# Patient Record
Sex: Female | Born: 1970 | Race: Black or African American | Hispanic: No | Marital: Single | Smoking: Never smoker
Health system: Southern US, Community
[De-identification: ages and names within clinical notes are randomized; demographics above are authoritative.]

## PROBLEM LIST (undated history)

## (undated) DIAGNOSIS — M1990 Unspecified osteoarthritis, unspecified site: Secondary | ICD-10-CM

## (undated) DIAGNOSIS — F410 Panic disorder [episodic paroxysmal anxiety] without agoraphobia: Secondary | ICD-10-CM

## (undated) DIAGNOSIS — M069 Rheumatoid arthritis, unspecified: Secondary | ICD-10-CM

## (undated) DIAGNOSIS — Z86718 Personal history of other venous thrombosis and embolism: Secondary | ICD-10-CM

## (undated) DIAGNOSIS — I8390 Asymptomatic varicose veins of unspecified lower extremity: Secondary | ICD-10-CM

## (undated) DIAGNOSIS — R011 Cardiac murmur, unspecified: Secondary | ICD-10-CM

## (undated) DIAGNOSIS — D649 Anemia, unspecified: Secondary | ICD-10-CM

## (undated) DIAGNOSIS — M62838 Other muscle spasm: Secondary | ICD-10-CM

## (undated) DIAGNOSIS — D573 Sickle-cell trait: Secondary | ICD-10-CM

## (undated) DIAGNOSIS — D509 Iron deficiency anemia, unspecified: Secondary | ICD-10-CM

## (undated) DIAGNOSIS — Z9289 Personal history of other medical treatment: Secondary | ICD-10-CM

## (undated) HISTORY — DX: Rheumatoid arthritis, unspecified: M06.9

## (undated) HISTORY — DX: Personal history of other venous thrombosis and embolism: Z86.718

## (undated) HISTORY — DX: Sickle-cell trait: D57.3

## (undated) HISTORY — DX: Anemia, unspecified: D64.9

## (undated) HISTORY — DX: Asymptomatic varicose veins of unspecified lower extremity: I83.90

---

## 2005-07-16 HISTORY — PX: TUBAL LIGATION: SHX77

## 2011-10-08 ENCOUNTER — Emergency Department (HOSPITAL_COMMUNITY)
Admission: EM | Admit: 2011-10-08 | Discharge: 2011-10-08 | Disposition: A | Discharge status: Home / Self Care | Payer: Medicaid Other | Attending: Emergency Medicine | Admitting: Emergency Medicine

## 2011-10-08 ENCOUNTER — Emergency Department (HOSPITAL_COMMUNITY)
Admission: EM | Admit: 2011-10-08 | Discharge: 2011-10-08 | Disposition: A | Discharge status: Home / Self Care | Payer: No Typology Code available for payment source

## 2011-10-08 ENCOUNTER — Encounter (HOSPITAL_COMMUNITY): Payer: Self-pay | Admitting: Emergency Medicine

## 2011-10-08 DIAGNOSIS — M545 Low back pain, unspecified: Secondary | ICD-10-CM

## 2011-10-08 MED ORDER — OXYCODONE HCL 5 MG PO TABA: 5.0000 mg | ORAL_TABLET | Freq: Four times a day (QID) | ORAL | Status: DC | PRN
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
## 2011-10-08 NOTE — Discharge Instructions (Signed)
Back Exercises Back exercises help treat and prevent back injuries. The goal of back exercises is to increase the strength of your abdominal and back muscles and the flexibility of your back. These exercises should be started when you no longer have back pain. Back exercises include:  Pelvic Tilt. Lie on your back with your knees bent. Tilt your pelvis until the lower part of your back is against the floor. Hold this position 5 to 10 sec and repeat 5 to 10 times.   Knee to Chest. Pull first 1 knee up against your chest and hold for 20 to 30 seconds, repeat this with the other knee, and then both knees. This may be done with the other leg straight or bent, whichever feels better.   Sit-Ups or Curl-Ups. Bend your knees 90 degrees. Start with tilting your pelvis, and do a partial, slow sit-up, lifting your trunk only 30 to 45 degrees off the floor. Take at least 2 to 3 seconds for each sit-up. Do not do sit-ups with your knees out straight. If partial sit-ups are difficult, simply do the above but with only tightening your abdominal muscles and holding it as directed.   Hip-Lift. Lie on your back with your knees flexed 90 degrees. Push down with your feet and shoulders as you raise your hips a couple inches off the floor; hold for 10 seconds, repeat 5 to 10 times.   Back arches. Lie on your stomach, propping yourself up on bent elbows. Slowly press on your hands, causing an arch in your low back. Repeat 3 to 5 times. Any initial stiffness and discomfort should lessen with repetition over time.   Shoulder-Lifts. Lie face down with arms beside your body. Keep hips and torso pressed to floor as you slowly lift your head and shoulders off the floor.  Do not overdo your exercises, especially in the beginning. Exercises may cause you some mild back discomfort which lasts for a few minutes; however, if the pain is more severe, or lasts for more than 15 minutes, do not continue exercises until you see your  caregiver. Improvement with exercise therapy for back problems is slow.  See your caregivers for assistance with developing a proper back exercise program. Document Released: 08/09/2004 Document Revised: 06/21/2011 Document Reviewed: 07/02/2005 ExitCare Patient Information 2012 [employer].Back Pain, Adult Low back pain is very common. About 1 in 5 people have back pain.The cause of low back pain is rarely dangerous. The pain often gets better over time.About half of people with a sudden onset of back pain feel better in just 2 weeks. About 8 in 10 people feel better by 6 weeks.  CAUSES Some common causes of back pain include:  Strain of the muscles or ligaments supporting the spine.   Wear and tear (degeneration) of the spinal discs.   Arthritis.   Direct injury to the back.  DIAGNOSIS Most of the time, the direct cause of low back pain is not known.However, back pain can be treated effectively even when the exact cause of the pain is unknown.Answering your caregiver's questions about your overall health and symptoms is one of the most accurate ways to make sure the cause of your pain is not dangerous. If your caregiver needs more information, he or she may order lab work or imaging tests (X-rays or MRIs).However, even if imaging tests show changes in your back, this usually does not require surgery. HOME CARE INSTRUCTIONS For many people, back pain returns.Since low back pain is rarely   dangerous, it is often a condition that people can learn to manageon their own.   Remain active. It is stressful on the back to sit or stand in one place. Do not sit, drive, or stand in one place for more than 30 minutes at a time. Take short walks on level surfaces as soon as pain allows.Try to increase the length of time you walk each day.   Do not stay in bed.Resting more than 1 or 2 days can delay your recovery.   Do not avoid exercise or work.Your body is made to move.It is not dangerous  to be active, even though your back may hurt.Your back will likely heal faster if you return to being active before your pain is gone.   Pay attention to your body when you bend and lift. Many people have less discomfortwhen lifting if they bend their knees, keep the load close to their bodies,and avoid twisting. Often, the most comfortable positions are those that put less stress on your recovering back.   Find a comfortable position to sleep. Use a firm mattress and lie on your side with your knees slightly bent. If you lie on your back, put a pillow under your knees.   Only take over-the-counter or prescription medicines as directed by your caregiver. Over-the-counter medicines to reduce pain and inflammation are often the most helpful.Your caregiver may prescribe muscle relaxant drugs.These medicines help dull your pain so you can more quickly return to your normal activities and healthy exercise.   Put ice on the injured area.   Put ice in a plastic bag.   Place a towel between your skin and the bag.   Leave the ice on for 15 to 20 minutes, 3 to 4 times a day for the first 2 to 3 days. After that, ice and heat may be alternated to reduce pain and spasms.   Ask your caregiver about trying back exercises and gentle massage. This may be of some benefit.   Avoid feeling anxious or stressed.Stress increases muscle tension and can worsen back pain.It is important to recognize when you are anxious or stressed and learn ways to manage it.Exercise is a great option.  SEEK MEDICAL CARE IF:  You have pain that is not relieved with rest or medicine.   You have pain that does not improve in 1 week.   You have new symptoms.   You are generally not feeling well.  SEEK IMMEDIATE MEDICAL CARE IF:   You have pain that radiates from your back into your legs.   You develop new bowel or bladder control problems.   You have unusual weakness or numbness in your arms or legs.   You develop  nausea or vomiting.   You develop abdominal pain.   You feel faint.  Document Released: 07/02/2005 Document Revised: 06/21/2011 Document Reviewed: 11/20/2010 ExitCare Patient Information 2012 [employer].  RESOURCE GUIDE  Dental Problems  Patients with Medicaid: [facility]                     [facility] [address]                                           [address] Phone:  [phone]                                                    Phone:  [phone]  If unable to pay or uninsured, contact:  [facility] or [facility] to become qualified for the adult dental clinic.  Chronic Pain Problems Contact [facility]  [phone] Patients need to be referred by their primary care doctor.  Insufficient Money for Medicine Contact [employer]:  call "211" or [facility] 271-5999.  No Primary Care Doctor Call [facility]  832-8000 Other agencies that provide inexpensive medical care    [facility]  832-8035    [facility]  832-7272    [facility]  [phone]    [facility] Clinic  832-4777    [facility]  [phone]    [facility]  [phone]  Psychological Services [facility]  [phone] [facility]  [phone] [facility]   800 853-5163 (emergency services 641-4993)  Substance Abuse Resources [facility]  336-882-2125 [facility] 336-784-9470 [facility] 336-285-9073 [facility] 336-845-3988 Residential & Outpatient Substance Abuse Program  800-659-3381  Abuse/Neglect [place] Child Abuse Hotline (336) 641-3795 [place] Child Abuse Hotline 800-378-5315 (After Hours)  Emergency Shelter [facility] (336) 271-5985  [facility] (336) 275-9566 [facility] (704) 372-4663  MRSA Hotline #:    832-7006    [place] [facility]  [facility]     [employer]                          [facility] [address] [place]                       [address]      [address]  [place]                                                [place]                            [place] Phone:  [phone]                                   Phone:  [phone]                 Phone:  [phone]  [facility] Phone:  [phone]  [place] Child Abuse Hotline (336) 342-1394 (336) 342-3537 (After Hours)   

## 2011-10-09 ENCOUNTER — Encounter (HOSPITAL_COMMUNITY): Payer: Self-pay | Admitting: Emergency Medicine

## 2011-10-09 ENCOUNTER — Emergency Department (HOSPITAL_COMMUNITY)
Admission: EM | Admit: 2011-10-09 | Discharge: 2011-10-10 | Disposition: A | Discharge status: Home / Self Care | Payer: Medicaid Other | Attending: Emergency Medicine | Admitting: Emergency Medicine

## 2011-10-09 DIAGNOSIS — M545 Low back pain, unspecified: Secondary | ICD-10-CM

## 2011-10-09 DIAGNOSIS — IMO0002 Reserved for concepts with insufficient information to code with codable children: Secondary | ICD-10-CM | POA: Insufficient documentation

## 2011-10-09 MED ORDER — DIAZEPAM 5 MG PO TABS
5.0000 mg | ORAL_TABLET | Freq: Once | ORAL | Status: AC
  Administered 2011-10-09: 5 mg via ORAL
  Filled 2011-10-09: qty 1

## 2011-10-09 MED ORDER — HYDROMORPHONE HCL PF 1 MG/ML IJ SOLN
1.0000 mg | Freq: Once | INTRAMUSCULAR | Status: AC
  Administered 2011-10-09: 1 mg via INTRAMUSCULAR
  Filled 2011-10-09: qty 1

## 2011-10-09 MED ORDER — KETOROLAC TROMETHAMINE 30 MG/ML IJ SOLN
30.0000 mg | Freq: Once | INTRAMUSCULAR | Status: AC
  Administered 2011-10-09: 30 mg via INTRAMUSCULAR
  Filled 2011-10-09: qty 1
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
## 2011-10-09 NOTE — ED Provider Notes (Signed)
Medical screening examination/treatment/procedure(s) were performed by non-physician practitioner and as supervising physician I was immediately available for consultation/collaboration.    Tylik Treese R Leitha Hyppolite, MD 10/09/11 0044 

## 2011-10-10 ENCOUNTER — Encounter (HOSPITAL_COMMUNITY): Payer: Self-pay | Admitting: Emergency Medicine

## 2011-10-10 MED ORDER — CYCLOBENZAPRINE HCL 10 MG PO TABS: 10.0000 mg | ORAL_TABLET | Freq: Three times a day (TID) | ORAL | Status: AC | PRN
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
## 2011-10-10 NOTE — Discharge Instructions (Signed)
Narcotic and benzodiazepine use may cause drowsiness, slowed breathing or dependence.  Please use with caution and do not drive, operate machinery or watch young children alone while taking them.  Taking combinations of these medications or drinking alcohol will potentiate these effects.    

## 2011-11-23 ENCOUNTER — Ambulatory Visit
Admission: RE | Admit: 2011-11-23 | Discharge: 2011-11-23 | Disposition: A | Discharge status: Home / Self Care | Payer: Medicaid Other | Source: Ambulatory Visit | Attending: Family Medicine | Admitting: Family Medicine

## 2011-11-23 ENCOUNTER — Other Ambulatory Visit: Payer: Self-pay | Admitting: Family Medicine

## 2011-11-30 ENCOUNTER — Telehealth: Payer: Self-pay | Admitting: Oncology

## 2011-11-30 NOTE — Telephone Encounter (Signed)
lmonvm adviisng the pt of her new pt appt with dr ha °

## 2011-12-03 ENCOUNTER — Telehealth: Payer: Self-pay | Admitting: Oncology

## 2011-12-03 NOTE — Telephone Encounter (Signed)
S/w the pt and she is aware of her new pt appt on 12/11/2011 with dr Clelia Croft.

## 2011-12-03 NOTE — Telephone Encounter (Signed)
Referred by Dr. Alvin Blount Dx- Anemia  

## 2011-12-07 ENCOUNTER — Other Ambulatory Visit: Payer: Self-pay | Admitting: Oncology

## 2011-12-07 DIAGNOSIS — D649 Anemia, unspecified: Secondary | ICD-10-CM

## 2011-12-11 ENCOUNTER — Other Ambulatory Visit (HOSPITAL_BASED_OUTPATIENT_CLINIC_OR_DEPARTMENT_OTHER): Payer: Medicaid Other | Admitting: Lab

## 2011-12-11 ENCOUNTER — Ambulatory Visit: Payer: Medicaid Other

## 2011-12-11 ENCOUNTER — Telehealth: Payer: Self-pay | Admitting: Oncology

## 2011-12-11 ENCOUNTER — Ambulatory Visit (HOSPITAL_BASED_OUTPATIENT_CLINIC_OR_DEPARTMENT_OTHER): Payer: Medicaid Other | Admitting: Oncology

## 2011-12-11 ENCOUNTER — Encounter: Payer: Self-pay | Admitting: Oncology

## 2011-12-11 VITALS — BP 132/74 | HR 72 | Temp 98.6°F | Ht 64.0 in | Wt 179.2 lb

## 2011-12-11 DIAGNOSIS — D649 Anemia, unspecified: Secondary | ICD-10-CM

## 2011-12-11 HISTORY — DX: Anemia, unspecified: D64.9

## 2011-12-11 LAB — CBC WITH DIFFERENTIAL/PLATELET
BASO%: 0.5 % (ref 0.0–2.0)
Basophils Absolute: 0 10*3/uL (ref 0.0–0.1)
EOS%: 4.5 % (ref 0.0–7.0)
HGB: 7.2 g/dL — ABNORMAL LOW (ref 11.6–15.9)
MCH: 16.9 pg — ABNORMAL LOW (ref 25.1–34.0)
MCHC: 30.7 g/dL — ABNORMAL LOW (ref 31.5–36.0)
MCV: 55.1 fL — ABNORMAL LOW (ref 79.5–101.0)
MONO%: 4.8 % (ref 0.0–14.0)
NEUT%: 70.8 % (ref 38.4–76.8)
RDW: 22.1 % — ABNORMAL HIGH (ref 11.2–14.5)

## 2011-12-11 NOTE — Progress Notes (Signed)
Note dictated

## 2011-12-11 NOTE — Telephone Encounter (Signed)
Gv pt appt for [id number] 

## 2011-12-12 ENCOUNTER — Ambulatory Visit (HOSPITAL_BASED_OUTPATIENT_CLINIC_OR_DEPARTMENT_OTHER): Payer: Medicaid Other

## 2011-12-12 ENCOUNTER — Encounter: Payer: Self-pay | Admitting: Oncology

## 2011-12-12 VITALS — BP 113/66 | HR 79 | Temp 98.1°F

## 2011-12-12 DIAGNOSIS — D509 Iron deficiency anemia, unspecified: Secondary | ICD-10-CM

## 2011-12-12 DIAGNOSIS — D649 Anemia, unspecified: Secondary | ICD-10-CM

## 2011-12-12 MED ORDER — SODIUM CHLORIDE 0.9 % IV SOLN
Freq: Once | INTRAVENOUS | Status: AC
  Administered 2011-12-12: 16:00:00 via INTRAVENOUS

## 2011-12-12 MED ORDER — SODIUM CHLORIDE 0.9 % IV SOLN
1020.0000 mg | Freq: Once | INTRAVENOUS | Status: AC
  Administered 2011-12-12: 1020 mg via INTRAVENOUS
  Filled 2011-12-12: qty 34

## 2011-12-12 NOTE — Progress Notes (Signed)
New patient, patient has medicaid, patient was not in need of financial assistance patient was very emotional, she thought she was here for Cancer,I gave her my contact information.
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
## 2011-12-12 NOTE — Patient Instructions (Signed)
[facility] Discharge Instructions  Today you received the following chemotherapy agents Fereheme (Iron Infusion)  This will help in increasing your iron levels related to iron deficiency anemia.    If you develop nausea and vomiting that is not controlled by your nausea medication, call the clinic. If it is after clinic hours your family physician or the after hours number for the clinic or go to the Emergency Department.   BELOW ARE SYMPTOMS THAT SHOULD BE REPORTED IMMEDIATELY:  *FEVER GREATER THAN 100.5 F  *CHILLS WITH OR WITHOUT FEVER  NAUSEA AND VOMITING THAT IS NOT CONTROLLED WITH YOUR NAUSEA MEDICATION  *UNUSUAL SHORTNESS OF BREATH  *UNUSUAL BRUISING OR BLEEDING  TENDERNESS IN MOUTH AND THROAT WITH OR WITHOUT PRESENCE OF ULCERS  *URINARY PROBLEMS  *BOWEL PROBLEMS  UNUSUAL RASH Items with * indicate a potential emergency and should be followed up as soon as possible.  One of the nurses will contact you 24 hours after your treatment. Please let the nurse know about any problems that you may have experienced. Feel free to call the clinic you have any questions or concerns. The clinic phone number is [phone].   I have been informed and understand all the instructions given to me. I know to contact the clinic, my physician, or go to the Emergency Department if any problems should occur. I do not have any questions at this time, but understand that I may call the clinic during office hours   should I have any questions or need assistance in obtaining follow up care.    __________________________________________  _____________  __________ Signature of Patient or Authorized Representative            Date                   Time    __________________________________________ Nurse's Signature

## 2011-12-13 LAB — COMPREHENSIVE METABOLIC PANEL
AST: 19 U/L (ref 0–37)
Alkaline Phosphatase: 67 U/L (ref 39–117)
BUN: 12 mg/dL (ref 6–23)
Glucose, Bld: 103 mg/dL — ABNORMAL HIGH (ref 70–99)
Total Bilirubin: 0.2 mg/dL — ABNORMAL LOW (ref 0.3–1.2)

## 2011-12-13 LAB — IRON AND TIBC
Iron: <10 ug/dL — ABNORMAL LOW (ref 42–145)
UIBC: 423 ug/dL — ABNORMAL HIGH (ref 125–400)

## 2011-12-13 LAB — HEMOGLOBINOPATHY EVALUATION: Hgb A: 63.9 % — ABNORMAL LOW (ref 96.8–97.8)

## 2011-12-18 ENCOUNTER — Encounter: Payer: Self-pay | Admitting: Obstetrics and Gynecology

## 2011-12-18 ENCOUNTER — Ambulatory Visit (INDEPENDENT_AMBULATORY_CARE_PROVIDER_SITE_OTHER): Payer: Medicaid Other | Admitting: Obstetrics and Gynecology

## 2011-12-18 VITALS — BP 130/62 | Ht 64.0 in | Wt 182.0 lb

## 2011-12-18 DIAGNOSIS — D649 Anemia, unspecified: Secondary | ICD-10-CM

## 2011-12-18 DIAGNOSIS — N920 Excessive and frequent menstruation with regular cycle: Secondary | ICD-10-CM

## 2011-12-18 DIAGNOSIS — Z0000 Encounter for general adult medical examination without abnormal findings: Secondary | ICD-10-CM

## 2011-12-18 DIAGNOSIS — Z124 Encounter for screening for malignant neoplasm of cervix: Secondary | ICD-10-CM

## 2011-12-18 NOTE — Progress Notes (Signed)
Pt was referred from Dr. Bruna Potter. Pt c/o irregular periods with heavy bleeding. Pt states that her iron is low and also has low blood count. Pt reports she has abnormal bleeding. heavier than period and passing medium clots  It has been going on for over ten years years   Pt has dysmenorrhayes  Her pain is a 6/10.   She uses 1 of tampons or pads every 1 hours.  She has tried nothing.  It has not worked.  Pt denies chest pain or SOB Pt does get iron infusions for anemia Physical Examination: General appearance - alert, well appearing, and in no distress Mental status - normal mood, behavior, speech, dress, motor activity, and thought processes Neck - supple, no significant adenopathy, thyroid exam: thyroid is normal in size without nodules or tenderness Chest - clear to auscultation, no wheezes, rales or rhonchi, symmetric air entry Heart - normal rate and regular rhythm Abdomen - soft, nontender, nondistended, no masses or organomegaly Breasts - breasts appear normal, no suspicious masses, no skin or nipple changes or axillary nodes Pelvic - normal external genitalia, vulva, vagina, cervix, uterus and adnexa Rectal - normal rectal, no masses, rectal exam not indicated Back exam - full range of motion, no tenderness, palpable spasm or pain on motion Neurological - alert, oriented, normal speech, no focal findings or movement disorder noted Musculoskeletal - no joint tenderness, deformity or swelling Extremities - no edema, redness or tenderness in the calves or thighs Skin - normal coloration and turgor, no rashes, no suspicious skin lesions noted Menorrhagia, anemia Routine exam Pap sent yes Mammogram due yes and scheduled pt has BTL Need op notes from cesarean and consult from hematology.  Pt with h/o of c/s times 4 RT for shg/us / and embx

## 2011-12-21 ENCOUNTER — Telehealth: Payer: Self-pay

## 2011-12-21 LAB — PAP IG, CT-NG, RFX HPV ASCU: Chlamydia Probe Amp: NEGATIVE

## 2011-12-21 NOTE — Telephone Encounter (Signed)
Lm on v tc rgd roi

## 2011-12-21 NOTE — Telephone Encounter (Signed)
Lm on v tcb rgd roi

## 2011-12-21 NOTE — Telephone Encounter (Signed)
Spoke with pt rgd roi pt will have drs office fax records on monday

## 2011-12-24 ENCOUNTER — Telehealth: Payer: Self-pay

## 2011-12-24 NOTE — Telephone Encounter (Signed)
Message copied by Rolla Plate on Mon Dec 24, 2011 10:32 AM ------      Message from: Jaymes Graff      Created: Sat Dec 22, 2011 11:13 PM       Please tell patient her pap results and that she can repeat her pap in one year.  Thank you

## 2011-12-24 NOTE — Telephone Encounter (Signed)
Spoke with pt rgd labs informed pap ascus will repeat in 1 yr pt voice understanding

## 2011-12-27 ENCOUNTER — Telehealth: Payer: Self-pay | Admitting: Oncology

## 2011-12-27 NOTE — Telephone Encounter (Signed)
R/s 7/30 appt to 8/6 due to [facility] out. S/w pt re new appt for 8/6 today.

## 2012-01-10 ENCOUNTER — Ambulatory Visit
Admission: RE | Admit: 2012-01-10 | Discharge: 2012-01-10 | Disposition: A | Discharge status: Home / Self Care | Payer: Medicaid Other | Source: Ambulatory Visit | Attending: Obstetrics and Gynecology | Admitting: Obstetrics and Gynecology

## 2012-01-10 DIAGNOSIS — Z0000 Encounter for general adult medical examination without abnormal findings: Secondary | ICD-10-CM

## 2012-02-12 ENCOUNTER — Other Ambulatory Visit: Payer: Medicaid Other | Admitting: Lab

## 2012-02-12 ENCOUNTER — Ambulatory Visit: Payer: Medicaid Other | Admitting: Oncology

## 2012-02-19 ENCOUNTER — Other Ambulatory Visit (HOSPITAL_BASED_OUTPATIENT_CLINIC_OR_DEPARTMENT_OTHER): Payer: Medicaid Other | Admitting: Lab

## 2012-02-19 ENCOUNTER — Ambulatory Visit (HOSPITAL_BASED_OUTPATIENT_CLINIC_OR_DEPARTMENT_OTHER): Payer: Medicaid Other | Admitting: Oncology

## 2012-02-19 ENCOUNTER — Encounter: Payer: Self-pay | Admitting: Oncology

## 2012-02-19 ENCOUNTER — Telehealth: Payer: Self-pay | Admitting: *Deleted

## 2012-02-19 VITALS — BP 140/86 | HR 74 | Temp 97.1°F | Resp 18 | Ht 64.0 in | Wt 192.6 lb

## 2012-02-19 DIAGNOSIS — D649 Anemia, unspecified: Secondary | ICD-10-CM

## 2012-02-19 DIAGNOSIS — D509 Iron deficiency anemia, unspecified: Secondary | ICD-10-CM

## 2012-02-19 LAB — CBC WITH DIFFERENTIAL/PLATELET
EOS%: 5.2 % (ref 0.0–7.0)
Eosinophils Absolute: 0.2 10*3/uL (ref 0.0–0.5)
LYMPH%: 29.9 % (ref 14.0–49.7)
MCH: 24.8 pg — ABNORMAL LOW (ref 25.1–34.0)
MCHC: 34.5 g/dL (ref 31.5–36.0)
MCV: 71.9 fL — ABNORMAL LOW (ref 79.5–101.0)
MONO%: 6.5 % (ref 0.0–14.0)
NEUT#: 2.7 10*3/uL (ref 1.5–6.5)
Platelets: 193 10*3/uL (ref 145–400)
RBC: 4.56 10*6/uL (ref 3.70–5.45)
nRBC: 0 % (ref 0–0)

## 2012-02-19 LAB — COMPREHENSIVE METABOLIC PANEL
ALT: 11 U/L (ref 0–35)
CO2: 24 mEq/L (ref 19–32)
Creatinine, Ser: 0.8 mg/dL (ref 0.50–1.10)
Total Bilirubin: 0.2 mg/dL — ABNORMAL LOW (ref 0.3–1.2)

## 2012-02-19 LAB — IRON AND TIBC
%SAT: 13 % — ABNORMAL LOW (ref 20–55)
Iron: 46 ug/dL (ref 42–145)
UIBC: 299 ug/dL (ref 125–400)

## 2012-02-19 LAB — FERRITIN: Ferritin: 5 ng/mL — ABNORMAL LOW (ref 10–291)

## 2012-02-19 NOTE — Patient Instructions (Addendum)
Iron-Rich Diet An iron-rich diet contains foods that are good sources of iron. Iron is an important mineral that helps your body produce hemoglobin. Hemoglobin is a protein in red blood cells that carries oxygen to the body's tissues. Sometimes, the iron level in your blood can be low. This may be caused by:  A lack of iron in your diet.   Blood loss.   Times of growth, such as during pregnancy or during a child's growth and development.  Low levels of iron can cause a decrease in the number of red blood cells. This can result in iron deficiency anemia. Iron deficiency anemia symptoms include:  Tiredness.   Weakness.   Irritability.   Increased chance of infection.  Here are some recommendations for daily iron intake:  Males older than 42 years of age need 8 mg of iron per day.   Women ages 36 to 61 need 18 mg of iron per day.   Pregnant women need 27 mg of iron per day, and women who are over 36 years of age and breastfeeding need 9 mg of iron per day.   Women over the age of 82 need 8 mg of iron per day.  SOURCES OF IRON There are 2 types of iron that are found in food: heme iron and nonheme iron. Heme iron is absorbed by the body better than nonheme iron. Heme iron is found in meat, poultry, and fish. Nonheme iron is found in grains, beans, and vegetables. Heme Iron Sources Food / Iron (mg)  Chicken liver, 3 oz (85 g)/ 10 mg   Beef liver, 3 oz (85 g)/ 5.5 mg   Oysters, 3 oz (85 g)/ 8 mg   Beef, 3 oz (85 g)/ 2 to 3 mg   Shrimp, 3 oz (85 g)/ 2.8 mg   [place], 3 oz (85 g)/ 2 mg   Chicken, 3 oz (85 g) / 1 mg   Fish (tuna, halibut), 3 oz (85 g)/ 1 mg   Pork, 3 oz (85 g)/ 0.9 mg  Nonheme Iron Sources Food / Iron (mg)  Ready-to-eat breakfast cereal, iron-fortified / 3.9 to 7 mg   Tofu,  cup / 3.4 mg   Kidney beans,  cup / 2.6 mg   Baked potato with skin / 2.7 mg   Asparagus,  cup / 2.2 mg   Avocado / 2 mg   Dried peaches,  cup / 1.6 mg   Raisins,  cup  / 1.5 mg   Soy milk, 1 cup / 1.5 mg   Whole-wheat bread, 1 slice / 1.2 mg   Spinach, 1 cup / 0.8 mg   Broccoli,  cup / 0.6 mg  IRON ABSORPTION Certain foods can decrease the body's absorption of iron. Try to avoid these foods and beverages while eating meals with iron-containing foods:  Coffee.   Tea.   Fiber.   Soy.  Foods containing vitamin C can help increase the amount of iron your body absorbs from iron sources, especially from nonheme sources. Eat foods with vitamin C along with iron-containing foods to increase your iron absorption. Foods that are high in vitamin C include many fruits and vegetables. Some good sources are:  Fresh orange juice.   Oranges.   Strawberries.   Mangoes.   Grapefruit.   Red bell peppers.   Green bell peppers.   Broccoli.   Potatoes with skin.   Tomato juice.  Document Released: 02/13/2005 Document Revised: 06/21/2011 Document Reviewed: 12/21/2010 [facility] Patient Information 2012 [place]. 

## 2012-02-19 NOTE — Telephone Encounter (Signed)
Gave patient appointment for 05-21-2012 starting at 9:30am

## 2012-02-19 NOTE — Progress Notes (Signed)
Hematology and Oncology Follow Up Visit  Kimberly Grimes [id number] October 19, 1970 41 y.o. 02/19/2012 9:45 AM Kimberly Grimes, [name], Kimberly Grimes, *   Principle Diagnosis: Iron deficiency anemia  Prior Therapy: Feraheme 1,020 mg IV on 12/12/11  Current therapy: Watchful observation  Interim History: Kimberly Grimes is a 41 year old female seen for routine follow-up today. She received a Feraheme infusion in May. No infusion-related reaction. She states she has been taking an over-the-counter iron supplement once a day. She remains fatigued. Appetite has been increased since the iron infusion. Continues to have heavy periods that last about 1 week. Denies chest pain, shortness of breath, and dyspnea. No abdominal pain, nausea, or vomiting. Denies hematuria and melena.   Medications: I have reviewed the patient's current medications. Current outpatient prescriptions:ALBUTEROL IN, Inhale 14.7 g into the lungs as needed., Disp: , Rfl: ;  Cyclobenzaprine HCl (FLEXERIL PO), Take by mouth as needed., Disp: , Rfl: ;  oxyCODONE (OXY IR/ROXICODONE) 5 MG immediate release tablet, Take 5 mg by mouth every 6 (six) hours as needed. For pain, Disp: , Rfl: ;  vitamin B-12 (CYANOCOBALAMIN) 100 MCG tablet, Take 50 mcg by mouth every other day., Disp: , Rfl:   Allergies:  Allergies  Allergen Reactions  . Tylenol (Acetaminophen) Other (See Comments)    Unknown reaction    Past Medical History, Surgical history, Social history, and Family History were reviewed and updated.  Review of Systems: Constitutional:  Negative for fever, chills, night sweats, anorexia, weight loss, pain. Cardiovascular: no chest pain or dyspnea on exertion Respiratory: no cough, shortness of breath, or wheezing Neurological: no TIA or stroke symptoms Dermatological: negative ENT: negative Skin: Negative. Gastrointestinal: no abdominal pain, change in bowel habits, or black or bloody stools Genito-Urinary: no dysuria, trouble  voiding, or hematuria Hematological and Lymphatic: negative Breast: negative for breast lumps Musculoskeletal: negative Remaining ROS negative.  Physical Exam: Blood pressure 140/86, pulse 74, temperature 97.1 F (36.2 C), temperature source Oral, resp. rate 18, height 5\' 4"  (1.626 m), weight 192 lb 9.6 oz (87.363 kg). ECOG:  General appearance: alert, cooperative and no distress Head: Normocephalic, without obvious abnormality, atraumatic Neck: no adenopathy, no carotid bruit, no JVD, supple, symmetrical, trachea midline and thyroid not enlarged, symmetric, no tenderness/mass/nodules Lymph nodes: Cervical, supraclavicular, and axillary nodes normal. Heart:regular rate and rhythm, S1, S2 normal, no murmur, click, rub or gallop Lung:chest clear, no wheezing, rales, normal symmetric air entry, no tachypnea, retractions or cyanosis Abdomen: soft, non-tender, without masses or organomegaly EXT:no erythema, induration, or nodules   Lab Results: Lab Results  Component Value Date   WBC 4.6 02/19/2012   HGB 11.3* 02/19/2012   HCT 32.8* 02/19/2012   MCV 71.9* 02/19/2012   PLT 193 02/19/2012     Chemistry      Component Value Date/Time   NA 139 12/11/2011 1038   K 4.0 12/11/2011 1038   CL 107 12/11/2011 1038   CO2 26 12/11/2011 1038   BUN 12 12/11/2011 1038   CREATININE 0.74 12/11/2011 1038      Component Value Date/Time   CALCIUM 8.7 12/11/2011 1038   ALKPHOS 67 12/11/2011 1038   AST 19 12/11/2011 1038   ALT 15 12/11/2011 1038   BILITOT 0.2* 12/11/2011 1038     Impression and Plan: This is a 41 year old female with the following issues:  1. Iron deficiency anemia. The patient has received 1 dose of Feraheme in May 2013. She tolerated the infusion well without any infusion-related toxicity. Her hemoglobin  has now improved to 11.3. Her iron studies are pending at this time. Will await her iron studies form today. She may continue oral iron as she is already taking. I have given her a list of iron  rich foods. 2. Follow-up in about 3 months  Spent more than half the time coordinating care.    [place], [place] 8/6/20139:45 AM

## 2012-02-20 ENCOUNTER — Other Ambulatory Visit: Payer: Self-pay | Admitting: Oncology

## 2012-02-20 ENCOUNTER — Telehealth: Payer: Self-pay | Admitting: Oncology

## 2012-02-20 NOTE — Telephone Encounter (Signed)
Pt came in today to check on iron appt. Per pt she received a call that she would have iron. Pt given appt for 8/9. appt scheduled per 8/7 pof. No other orders and November appt confirmed.

## 2012-02-22 ENCOUNTER — Ambulatory Visit (HOSPITAL_BASED_OUTPATIENT_CLINIC_OR_DEPARTMENT_OTHER): Payer: Medicaid Other

## 2012-02-22 VITALS — BP 120/80 | HR 55 | Temp 98.1°F | Resp 16

## 2012-02-22 DIAGNOSIS — D509 Iron deficiency anemia, unspecified: Secondary | ICD-10-CM

## 2012-02-22 DIAGNOSIS — D649 Anemia, unspecified: Secondary | ICD-10-CM

## 2012-02-22 MED ORDER — SODIUM CHLORIDE 0.9 % IV SOLN
INTRAVENOUS | Status: DC
  Administered 2012-02-22: 09:00:00 via INTRAVENOUS

## 2012-02-22 MED ORDER — SODIUM CHLORIDE 0.9 % IV SOLN
1020.0000 mg | Freq: Once | INTRAVENOUS | Status: AC
  Administered 2012-02-22: 1020 mg via INTRAVENOUS
  Filled 2012-02-22: qty 34

## 2012-02-22 NOTE — Patient Instructions (Signed)
Patient discharged home with no complaints. Patient aware of next appointment. 

## 2012-02-24 ENCOUNTER — Emergency Department (HOSPITAL_COMMUNITY)
Admission: EM | Admit: 2012-02-24 | Discharge: 2012-02-24 | Disposition: A | Discharge status: Home / Self Care | Payer: Medicaid Other | Attending: Emergency Medicine | Admitting: Emergency Medicine

## 2012-02-24 ENCOUNTER — Emergency Department (HOSPITAL_COMMUNITY): Payer: Medicaid Other

## 2012-02-24 ENCOUNTER — Encounter (HOSPITAL_COMMUNITY): Payer: Self-pay | Admitting: Emergency Medicine

## 2012-02-24 DIAGNOSIS — I10 Essential (primary) hypertension: Secondary | ICD-10-CM | POA: Insufficient documentation

## 2012-02-24 DIAGNOSIS — M25569 Pain in unspecified knee: Secondary | ICD-10-CM

## 2012-02-24 DIAGNOSIS — D573 Sickle-cell trait: Secondary | ICD-10-CM | POA: Insufficient documentation

## 2012-02-24 DIAGNOSIS — D649 Anemia, unspecified: Secondary | ICD-10-CM | POA: Insufficient documentation

## 2012-02-24 MED ORDER — IBUPROFEN 600 MG PO TABS: 600.0000 mg | ORAL_TABLET | Freq: Four times a day (QID) | ORAL | Status: AC | PRN

## 2012-02-24 MED ORDER — TRAMADOL HCL 50 MG PO TABS: 50.0000 mg | ORAL_TABLET | Freq: Four times a day (QID) | ORAL | Status: AC | PRN

## 2012-02-24 NOTE — ED Notes (Signed)
Pt c/o left knee pain that started at 1800 today, unable to sleep from it. 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 Pt denies taking anything for the pain.

## 2012-02-24 NOTE — ED Notes (Signed)
Pt currently in room 2 with husband.

## 2012-02-24 NOTE — ED Notes (Signed)
Pt alert, arrives from home, c/o left knee pain, onset was this evening, denies recent trauma or injury, ambulates to triage, resp even unlabored, skin pwd

## 2012-02-24 NOTE — ED Provider Notes (Signed)
History     CSN: [id number]  Arrival date & time 02/24/12  0219   First MD Initiated Contact with Patient 02/24/12 0532      Chief Complaint  Patient presents with  . Knee Pain    (Consider location/radiation/quality/duration/timing/severity/associated sxs/prior treatment) Patient is a 41 y.o. female presenting with knee pain. The history is provided by the patient.  Knee Pain This is a new problem.   patient with left knee pain that began tonight. No recent trauma or overuse. No weakness. No swelling. She's not had pains like this before. No history of DVT. No rash.  Past Medical History  Diagnosis Date  . Anemia 12/11/2011  . Varicose veins   . Sickle cell trait   . Rheumatoid arthritis   . History of blood clots     Blood clots during menstrual cycle  . Low iron     Past Surgical History  Procedure Date  . Tubal ligation 2007    Family History  Problem Relation Age of Onset  . Rheum arthritis Mother     History  Substance Use Topics  . Smoking status: Never Smoker   . Smokeless tobacco: Not on file  . Alcohol Use: Yes     socially    OB History    Grav Para Term Preterm Abortions TAB SAB Ect Mult Living   4        1 5       Review of Systems  Constitutional: Negative for fever and chills.  Musculoskeletal: Negative for back pain and joint swelling.  Skin: Negative for rash and wound.    Allergies  Tylenol  Home Medications   Current Outpatient Rx  Name Route Sig Dispense Refill  . ALBUTEROL SULFATE HFA 108 (90 BASE) MCG/ACT IN AERS Inhalation Inhale 2 puffs into the lungs every 6 (six) hours as needed. For shortness of breath    . CYCLOBENZAPRINE HCL 10 MG PO TABS Oral Take 10 mg by mouth 3 (three) times daily as needed. For muscle spasms    . IBUPROFEN 600 MG PO TABS Oral Take 1 tablet (600 mg total) by mouth every 6 (six) hours as needed for pain. 20 tablet 0  . TRAMADOL HCL 50 MG PO TABS Oral Take 1 tablet (50 mg total) by mouth every 6 (six)  hours as needed for pain. 15 tablet 0    BP 163/83  Pulse 72  Temp 99.1 F (37.3 C) (Oral)  Resp 16  Ht 5\' 4"  (1.626 m)  Wt 187 lb (84.823 kg)  BMI 32.10 kg/m2  SpO2 100%  LMP 02/24/2012  Physical Exam  Constitutional: She appears well-developed and well-nourished.  Musculoskeletal: Normal range of motion. She exhibits no edema and no tenderness.       Left knee range of motion intact. Stable joint. Neurovascular intact distally. No edema. No palpable effusion  Skin: Skin is warm. No rash noted.    ED Course  Procedures (including critical care time)  Labs Reviewed - No data to display Dg Knee Complete 4 Views Left  02/24/2012  *RADIOLOGY REPORT*  Clinical Data: Left anterior knee pain  LEFT KNEE - COMPLETE 4+ VIEW  Comparison: None.  Findings: No displaced fracture or dislocation.  No aggressive osseous lesions.  No overt degenerative changes. Tiny joint effusion.  IMPRESSION: No acute osseous abnormality of the left knee.  Tiny joint effusion.  Original Report Authenticated By: Waneta Martins, M.D.     1. Knee pain  MDM  Left knee pain. Minor history of rheumatoid arthritis. Doubt DVT. Negative x-ray except for trace effusion.        [name]. Rubin Payor, MD 02/24/12 [phone]

## 2012-02-24 NOTE — ED Notes (Addendum)
Pt walked over to triage, because her husband was checking in.

## 2012-02-24 NOTE — ED Notes (Signed)
Pt back from X-Ray, talking with charge nurse about an issue staff had with husband.

## 2012-02-24 NOTE — ED Notes (Signed)
Pt transported to XR.  

## 2012-05-05 ENCOUNTER — Emergency Department (HOSPITAL_COMMUNITY): Payer: Medicaid Other

## 2012-05-05 ENCOUNTER — Encounter (HOSPITAL_COMMUNITY): Payer: Self-pay | Admitting: *Deleted

## 2012-05-05 ENCOUNTER — Emergency Department (HOSPITAL_COMMUNITY)
Admission: EM | Admit: 2012-05-05 | Discharge: 2012-05-05 | Disposition: A | Discharge status: Home / Self Care | Payer: Medicaid Other | Attending: Emergency Medicine | Admitting: Emergency Medicine

## 2012-05-05 DIAGNOSIS — M069 Rheumatoid arthritis, unspecified: Secondary | ICD-10-CM | POA: Insufficient documentation

## 2012-05-05 DIAGNOSIS — Z9889 Other specified postprocedural states: Secondary | ICD-10-CM | POA: Insufficient documentation

## 2012-05-05 DIAGNOSIS — R51 Headache: Secondary | ICD-10-CM

## 2012-05-05 DIAGNOSIS — Z862 Personal history of diseases of the blood and blood-forming organs and certain disorders involving the immune mechanism: Secondary | ICD-10-CM | POA: Insufficient documentation

## 2012-05-05 DIAGNOSIS — R42 Dizziness and giddiness: Secondary | ICD-10-CM

## 2012-05-05 DIAGNOSIS — R110 Nausea: Secondary | ICD-10-CM | POA: Insufficient documentation

## 2012-05-05 DIAGNOSIS — M62838 Other muscle spasm: Secondary | ICD-10-CM | POA: Insufficient documentation

## 2012-05-05 DIAGNOSIS — Z8679 Personal history of other diseases of the circulatory system: Secondary | ICD-10-CM | POA: Insufficient documentation

## 2012-05-05 HISTORY — DX: Other muscle spasm: M62.838

## 2012-05-05 MED ORDER — SODIUM CHLORIDE 0.9 % IV BOLUS (SEPSIS)
1000.0000 mL | Freq: Once | INTRAVENOUS | Status: AC
  Administered 2012-05-05: 1000 mL via INTRAVENOUS

## 2012-05-05 MED ORDER — DEXAMETHASONE SODIUM PHOSPHATE 10 MG/ML IJ SOLN
10.0000 mg | Freq: Once | INTRAMUSCULAR | Status: AC
  Administered 2012-05-05: 10 mg via INTRAVENOUS
  Filled 2012-05-05: qty 1

## 2012-05-05 MED ORDER — DIPHENHYDRAMINE HCL 50 MG/ML IJ SOLN
25.0000 mg | Freq: Once | INTRAMUSCULAR | Status: AC
  Administered 2012-05-05: 25 mg via INTRAVENOUS
  Filled 2012-05-05: qty 1

## 2012-05-05 MED ORDER — LORAZEPAM 1 MG PO TABS: 1.0000 mg | ORAL_TABLET | Freq: Three times a day (TID) | ORAL | Status: DC | PRN

## 2012-05-05 MED ORDER — MECLIZINE HCL 25 MG PO TABS: 25.0000 mg | ORAL_TABLET | Freq: Four times a day (QID) | ORAL | Status: DC | PRN

## 2012-05-05 MED ORDER — METOCLOPRAMIDE HCL 5 MG/ML IJ SOLN
10.0000 mg | Freq: Once | INTRAMUSCULAR | Status: AC
  Administered 2012-05-05: 10 mg via INTRAVENOUS
  Filled 2012-05-05: qty 2

## 2012-05-05 NOTE — ED Notes (Signed)
Patient transported to CT 

## 2012-05-05 NOTE — ED Provider Notes (Signed)
Medical screening examination/treatment/procedure(s) were performed by non-physician practitioner and as supervising physician I was immediately available for consultation/collaboration.    Vida Roller, MD 05/05/12 [phone]

## 2012-05-05 NOTE — ED Provider Notes (Signed)
History     CSN: [id number]  Arrival date & time 05/05/12  4098   First MD Initiated Contact with Patient 05/05/12 0411      Chief Complaint  Patient presents with  . Headache   HPI  History provided by the patient and significant other. Patient is a 41 year old female who presents with complaints of acute onset headache. Headache woke patient for sleep around 2 AM. She reports having a severe pain to her temporal for head area. Patient also reports standing and having a sensation of the room spinning for several minutes. This was accompanied with some nausea. Patient continues to report feeling some symptoms of dizziness. Headache has been unchanged since it woke her up. She has not used any medications or other treatments for her symptoms. She denies having history of similar symptoms. She denies having any confusion, speech difficulty, weakness or numbness in extremities. Patient denies any recent illness. No recent URI. No fever, chills or sweats. No chest pain or heart palpitations.    Past Medical History  Diagnosis Date  . Anemia 12/11/2011  . Varicose veins   . Sickle cell trait   . Rheumatoid arthritis   . History of blood clots     Blood clots during menstrual cycle  . Low iron   . Muscle spasms of lower extremity     Past Surgical History  Procedure Date  . Tubal ligation 2007    Family History  Problem Relation Age of Onset  . Rheum arthritis Mother     History  Substance Use Topics  . Smoking status: Never Smoker   . Smokeless tobacco: Not on file  . Alcohol Use: Yes     socially    OB History    Grav Para Term Preterm Abortions TAB SAB Ect Mult Living   4        1 5       Review of Systems  Constitutional: Negative for fever and chills.  HENT: Negative for hearing loss, ear pain, neck pain and tinnitus.   Respiratory: Negative for shortness of breath.   Cardiovascular: Negative for chest pain and palpitations.  Gastrointestinal: Positive for  nausea. Negative for vomiting.  Genitourinary: Negative for dysuria, frequency, hematuria, flank pain, vaginal bleeding, vaginal discharge and menstrual problem.  Neurological: Positive for dizziness and headaches. Negative for facial asymmetry, speech difficulty, weakness and numbness.    Allergies  Tylenol  Home Medications   Current Outpatient Rx  Name Route Sig Dispense Refill  . ALBUTEROL SULFATE HFA 108 (90 BASE) MCG/ACT IN AERS Inhalation Inhale 2 puffs into the lungs every 6 (six) hours as needed. For shortness of breath    . CYCLOBENZAPRINE HCL 10 MG PO TABS Oral Take 10 mg by mouth 3 (three) times daily as needed. For muscle spasms      BP 167/84  Temp 98.5 F (36.9 C) (Oral)  Resp 18  SpO2 98%  Physical Exam  Nursing note and vitals reviewed. Constitutional: She is oriented to person, place, and time. She appears well-developed and well-nourished. No distress.  HENT:  Head: Normocephalic and atraumatic.  Eyes: Conjunctivae normal and EOM are normal. Pupils are equal, round, and reactive to light.  Neck: Normal range of motion. Neck supple.       No meningeal signs  Cardiovascular: Normal rate and regular rhythm.   No murmur heard. Pulmonary/Chest: Effort normal and breath sounds normal. No respiratory distress. She has no wheezes. She has no rales.  Abdominal: Soft. There  is no tenderness. There is no rigidity, no rebound, no guarding, no CVA tenderness and no tenderness at McBurney's point.  Neurological: She is alert and oriented to person, place, and time. She has normal strength. No cranial nerve deficit or sensory deficit. Coordination and gait normal.  Skin: Skin is warm and dry. No rash noted.  Psychiatric: She has a normal mood and affect. Her behavior is normal.    ED Course  Procedures   Ct Head Wo Contrast  05/05/2012  *RADIOLOGY REPORT*  Clinical Data: Headache  CT HEAD WITHOUT CONTRAST  Technique:  Contiguous axial images were obtained from the base  of the skull through the vertex without contrast.  Comparison: None.  Findings: There is no evidence for acute hemorrhage, hydrocephalus, mass lesion, or abnormal extra-axial fluid collection.  No definite CT evidence for acute infarction.  The visualized paranasal sinuses and mastoid air cells are predominately clear.  IMPRESSION: No acute intracranial abnormality.   Original Report Authenticated By: Waneta Martins, M.D.      1. Headache   2. Dizziness       MDM  4:15 AM patient seen and evaluated. Patient appears uncomfortable but in no acute distress.  Patient with unremarkable neurologic exam. Patient ambulates without assistance to the bathroom without ataxia. She has normal upper extremity coordination with finger to nose. CT scan is unremarkable. At this time do not suspect concerning cause of patient's headache and dizziness symptoms.  Patient was discussed with attending physician and agrees with work up and plan.      Angus Seller, [place] 05/05/12 [phone]

## 2012-05-05 NOTE — ED Notes (Signed)
Pt c/o feeling very hot.  Temp 98.4.  Pt requested ice pack to place on forehead for comfort.

## 2012-05-05 NOTE — ED Notes (Signed)
Pt states HA for 30 min pt states in tempal area. Pt states she was asleep, woke up and then started having a HA. Pt alert and oriented x4, pt able to move all extremiteis and follow commands.

## 2012-05-05 NOTE — ED Notes (Addendum)
Immediately after Decadron administration, patient began experiencing vaginal itching and burning.  Pt stable at this time.  Sensitivity added to patient's allergies list.

## 2012-05-13 ENCOUNTER — Telehealth: Payer: Self-pay | Admitting: Oncology

## 2012-05-13 NOTE — Telephone Encounter (Signed)
Pt needed to change appt time to Nov 22nd

## 2012-05-21 ENCOUNTER — Ambulatory Visit: Payer: Medicaid Other | Admitting: Oncology

## 2012-05-21 ENCOUNTER — Other Ambulatory Visit: Payer: Medicaid Other | Admitting: Lab

## 2012-06-06 ENCOUNTER — Telehealth: Payer: Self-pay | Admitting: Oncology

## 2012-06-06 ENCOUNTER — Encounter: Payer: Self-pay | Admitting: *Deleted

## 2012-06-06 ENCOUNTER — Ambulatory Visit (HOSPITAL_BASED_OUTPATIENT_CLINIC_OR_DEPARTMENT_OTHER): Payer: Medicaid Other | Admitting: Oncology

## 2012-06-06 ENCOUNTER — Other Ambulatory Visit (HOSPITAL_BASED_OUTPATIENT_CLINIC_OR_DEPARTMENT_OTHER): Payer: Medicaid Other | Admitting: Lab

## 2012-06-06 ENCOUNTER — Telehealth: Payer: Self-pay | Admitting: *Deleted

## 2012-06-06 DIAGNOSIS — D649 Anemia, unspecified: Secondary | ICD-10-CM

## 2012-06-06 DIAGNOSIS — D509 Iron deficiency anemia, unspecified: Secondary | ICD-10-CM

## 2012-06-06 DIAGNOSIS — N920 Excessive and frequent menstruation with regular cycle: Secondary | ICD-10-CM

## 2012-06-06 LAB — CBC WITH DIFFERENTIAL/PLATELET
BASO%: 0.6 % (ref 0.0–2.0)
Eosinophils Absolute: 0.2 10*3/uL (ref 0.0–0.5)
HCT: 37.3 % (ref 34.8–46.6)
HGB: 12.8 g/dL (ref 11.6–15.9)
LYMPH%: 18.7 % (ref 14.0–49.7)
MCHC: 34.2 g/dL (ref 31.5–36.0)
MONO#: 0.4 10*3/uL (ref 0.1–0.9)
NEUT#: 4.2 10*3/uL (ref 1.5–6.5)
NEUT%: 71.8 % (ref 38.4–76.8)
Platelets: 222 10*3/uL (ref 145–400)
WBC: 5.9 10*3/uL (ref 3.9–10.3)
lymph#: 1.1 10*3/uL (ref 0.9–3.3)

## 2012-06-06 LAB — IRON AND TIBC
%SAT: 19 % — ABNORMAL LOW (ref 20–55)
TIBC: 341 ug/dL (ref 250–470)

## 2012-06-06 LAB — FERRITIN: Ferritin: 11 ng/mL (ref 10–291)

## 2012-06-06 NOTE — Telephone Encounter (Signed)
Emailed michelle to add iv..the patient aware...gv pt Feb 2014 appt schedule for lab and est...will call pt when michelle responds with d/t

## 2012-06-06 NOTE — Telephone Encounter (Signed)
Per staff message I have scheduled appts. JMW  

## 2012-06-06 NOTE — Telephone Encounter (Signed)
s.w. pt and gv appt d/t for 11.26.13 @ 10:45am...pt aware

## 2012-06-06 NOTE — Progress Notes (Signed)
Hematology and Oncology Follow Up Visit  Kimberly Grimes [id number] [birth year] 41 y.o. 06/06/2012 9:37 AM [name], [name], [name], *   Principle Diagnosis: 41 year old women with iron deficiency anemia due to heavy menstrual bleeding.    Prior Therapy: Feraheme 1,020 mg IV on 12/12/11  Current therapy: Watchful observation  Interim History: Kimberly Grimes is a 41 year old female seen for routine follow-up today. She received a Feraheme infusion in May. No infusion-related reaction. She states she has been taking an over-the-counter iron supplement once a day. She remains fatigued. Appetite has been increased since the iron infusion. Continues to have heavy periods that last about 1 week. Denies chest pain, shortness of breath, and dyspnea. No abdominal pain, nausea, or vomiting. Denies hematuria and melena. She reported improvement in her symptoms for a while, but now she is noticing some decline in her overall performance status.   Medications: I have reviewed the patient's current medications. Current outpatient prescriptions:albuterol (PROVENTIL HFA;VENTOLIN HFA) 108 (90 BASE) MCG/ACT inhaler, Inhale 2 puffs into the lungs every 6 (six) hours as needed. For shortness of breath, Disp: , Rfl: ;  cyclobenzaprine (FLEXERIL) 10 MG tablet, Take 10 mg by mouth 3 (three) times daily as needed. For muscle spasms, Disp: , Rfl: ;  diclofenac (VOLTAREN) 75 MG EC tablet, Take 75 mg by mouth 2 (two) times daily., Disp: , Rfl:  LORazepam (ATIVAN) 1 MG tablet, Take 1 tablet (1 mg total) by mouth 3 (three) times daily as needed for anxiety (or dizziness)., Disp: 10 tablet, Rfl: 0;  meclizine (ANTIVERT) 25 MG tablet, Take 1 tablet (25 mg total) by mouth every 6 (six) hours as needed for dizziness., Disp: 20 tablet, Rfl: 0  Allergies:  Allergies  Allergen Reactions  . Decadron (Dexamethasone)     Vaginal itching and burning reaction.  . Tylenol (Acetaminophen) Other (See Comments)    Unknown  reaction    Past Medical History, Surgical history, Social history, and Family History were reviewed and updated.  Review of Systems: Constitutional:  Negative for fever, chills, night sweats, anorexia, weight loss, pain. Cardiovascular: no chest pain or dyspnea on exertion Respiratory: no cough, shortness of breath, or wheezing Neurological: no TIA or stroke symptoms Dermatological: negative ENT: negative Skin: Negative. Gastrointestinal: no abdominal pain, change in bowel habits, or black or bloody stools Genito-Urinary: no dysuria, trouble voiding, or hematuria Hematological and Lymphatic: negative Breast: negative for breast lumps Musculoskeletal: negative Remaining ROS negative.  Physical Exam: There were no vitals taken for this visit. ECOG:  General appearance: alert, cooperative and no distress Head: Normocephalic, without obvious abnormality, atraumatic Neck: no adenopathy, no carotid bruit, no JVD, supple, symmetrical, trachea midline and thyroid not enlarged, symmetric, no tenderness/mass/nodules Lymph nodes: Cervical, supraclavicular, and axillary nodes normal. Heart:regular rate and rhythm, S1, S2 normal, no murmur, click, rub or gallop Lung:chest clear, no wheezing, rales, normal symmetric air entry, no tachypnea, retractions or cyanosis Abdomen: soft, non-tender, without masses or organomegaly EXT:no erythema, induration, or nodules   Lab Results: Lab Results  Component Value Date   WBC 5.9 06/06/2012   HGB 12.8 06/06/2012   HCT 37.3 06/06/2012   MCV 83.2 06/06/2012   PLT 222 06/06/2012     Chemistry      Component Value Date/Time   NA 139 02/19/2012 0857   K 3.9 02/19/2012 0857   CL 105 02/19/2012 0857   CO2 24 02/19/2012 0857   BUN 10 02/19/2012 0857   CREATININE 0.80 02/19/2012 0857  Component Value Date/Time   CALCIUM 9.3 02/19/2012 0857   ALKPHOS 62 02/19/2012 0857   AST 14 02/19/2012 0857   ALT 11 02/19/2012 0857   BILITOT 0.2* 02/19/2012 0857      Impression and Plan: This is a 41 year old female with the following issues:  1. Iron deficiency anemia. The patient has received 1 dose of Feraheme in May 2013. She tolerated the infusion well without any infusion-related toxicity. Her hemoglobin has now improved to 12.8, but her Ferritin might still be low and might need more IV Fe. I will check Fe stores today. If her Fe studies continue to show low levels, I will give another Feraheme treatment next week.   2. Follow-up in about 3 months     Layann Bluett 11/22/20139:37 AM

## 2012-06-10 ENCOUNTER — Other Ambulatory Visit: Payer: Self-pay | Admitting: Oncology

## 2012-06-10 ENCOUNTER — Ambulatory Visit (HOSPITAL_BASED_OUTPATIENT_CLINIC_OR_DEPARTMENT_OTHER): Payer: Medicaid Other

## 2012-06-10 VITALS — BP 148/77 | HR 76 | Temp 99.2°F | Resp 20

## 2012-06-10 DIAGNOSIS — M25562 Pain in left knee: Secondary | ICD-10-CM

## 2012-06-10 DIAGNOSIS — N920 Excessive and frequent menstruation with regular cycle: Secondary | ICD-10-CM

## 2012-06-10 DIAGNOSIS — D509 Iron deficiency anemia, unspecified: Secondary | ICD-10-CM

## 2012-06-10 DIAGNOSIS — G8929 Other chronic pain: Secondary | ICD-10-CM

## 2012-06-10 DIAGNOSIS — D649 Anemia, unspecified: Secondary | ICD-10-CM

## 2012-06-10 MED ORDER — FERUMOXYTOL INJECTION 510 MG/17 ML
1020.0000 mg | Freq: Once | INTRAVENOUS | Status: DC
  Filled 2012-06-10: qty 34

## 2012-06-10 MED ORDER — IBUPROFEN 200 MG PO TABS: 400.0000 mg | ORAL_TABLET | Freq: Once | ORAL | Status: DC

## 2012-06-10 MED ORDER — SODIUM CHLORIDE 0.9 % IV SOLN
Freq: Once | INTRAVENOUS | Status: AC
  Administered 2012-06-10: 11:00:00 via INTRAVENOUS

## 2012-06-10 NOTE — Patient Instructions (Addendum)
[facility] Discharge Instructions for Patients Receiving Chemotherapy  Today you received feraheme to treat your anemia     BELOW ARE SYMPTOMS THAT SHOULD BE REPORTED IMMEDIATELY:  *FEVER GREATER THAN 100.5 F  *CHILLS WITH OR WITHOUT FEVER  NAUSEA AND VOMITING THAT IS NOT CONTROLLED WITH YOUR NAUSEA MEDICATION  *UNUSUAL SHORTNESS OF BREATH  *UNUSUAL BRUISING OR BLEEDING  TENDERNESS IN MOUTH AND THROAT WITH OR WITHOUT PRESENCE OF ULCERS  *URINARY PROBLEMS  *BOWEL PROBLEMS  UNUSUAL RASH Items with * indicate a potential emergency and should be followed up as soon as possible. . Feel free to call the clinic you have any questions or concerns. The clinic phone number is [phone].   I have been informed and understand all the instructions given to me. I know to contact the clinic, my physician, or go to the Emergency Department if any problems should occur. I do not have any questions at this time, but understand that I may call the clinic during office hours   should I have any questions or need assistance in obtaining follow up care.    __________________________________________  _____________  __________ Signature of Patient or Authorized Representative            Date                   Time    __________________________________________ Nurse's Signature

## 2012-06-10 NOTE — Progress Notes (Unsigned)
Patient requests Rx for pain medication after Feraheme infusion; spoke to both Clenton Pare, NP and Dr. Clelia Croft, and patient was referred to PCP; order in for Motrin approved for patient to receive prior to discharge, but patient refused; explained to husband and patient that PCP needs to handle Rxs for pain; verbalized understanding and states that they will follow up with current PCP.

## 2012-06-11 ENCOUNTER — Other Ambulatory Visit: Payer: Self-pay | Admitting: Family Medicine

## 2012-06-11 ENCOUNTER — Ambulatory Visit
Admission: RE | Admit: 2012-06-11 | Discharge: 2012-06-11 | Disposition: A | Discharge status: Home / Self Care | Payer: Medicaid Other | Source: Ambulatory Visit | Attending: Family Medicine | Admitting: Family Medicine

## 2012-06-11 DIAGNOSIS — IMO0002 Reserved for concepts with insufficient information to code with codable children: Secondary | ICD-10-CM

## 2012-06-11 DIAGNOSIS — M1710 Unilateral primary osteoarthritis, unspecified knee: Secondary | ICD-10-CM

## 2012-07-24 NOTE — Progress Notes (Signed)
Pt. Received feraheme 1020mg  on 06/10/12.

## 2012-08-17 ENCOUNTER — Emergency Department (HOSPITAL_COMMUNITY)
Admission: EM | Admit: 2012-08-17 | Discharge: 2012-08-17 | Disposition: A | Discharge status: Home / Self Care | Payer: Medicaid Other | Attending: Emergency Medicine | Admitting: Emergency Medicine

## 2012-08-17 ENCOUNTER — Encounter (HOSPITAL_COMMUNITY): Payer: Self-pay | Admitting: Emergency Medicine

## 2012-08-17 DIAGNOSIS — F410 Panic disorder [episodic paroxysmal anxiety] without agoraphobia: Secondary | ICD-10-CM | POA: Insufficient documentation

## 2012-08-17 DIAGNOSIS — M25539 Pain in unspecified wrist: Secondary | ICD-10-CM | POA: Insufficient documentation

## 2012-08-17 DIAGNOSIS — Z79899 Other long term (current) drug therapy: Secondary | ICD-10-CM | POA: Insufficient documentation

## 2012-08-17 DIAGNOSIS — Z8739 Personal history of other diseases of the musculoskeletal system and connective tissue: Secondary | ICD-10-CM | POA: Insufficient documentation

## 2012-08-17 DIAGNOSIS — Z8679 Personal history of other diseases of the circulatory system: Secondary | ICD-10-CM | POA: Insufficient documentation

## 2012-08-17 DIAGNOSIS — M79603 Pain in arm, unspecified: Secondary | ICD-10-CM

## 2012-08-17 DIAGNOSIS — Z862 Personal history of diseases of the blood and blood-forming organs and certain disorders involving the immune mechanism: Secondary | ICD-10-CM | POA: Insufficient documentation

## 2012-08-17 DIAGNOSIS — M79609 Pain in unspecified limb: Secondary | ICD-10-CM | POA: Insufficient documentation

## 2012-08-17 HISTORY — DX: Panic disorder (episodic paroxysmal anxiety): F41.0

## 2012-08-17 MED ORDER — OXYCODONE-ACETAMINOPHEN 5-325 MG PO TABS: 1.0000 | ORAL_TABLET | Freq: Four times a day (QID) | ORAL | Status: DC | PRN

## 2012-08-17 MED ORDER — IBUPROFEN 800 MG PO TABS
800.0000 mg | ORAL_TABLET | Freq: Once | ORAL | Status: AC
  Administered 2012-08-17: 800 mg via ORAL
  Filled 2012-08-17: qty 1

## 2012-08-17 MED ORDER — CYCLOBENZAPRINE HCL 10 MG PO TABS
10.0000 mg | ORAL_TABLET | Freq: Once | ORAL | Status: AC
  Administered 2012-08-17: 10 mg via ORAL
  Filled 2012-08-17: qty 1

## 2012-08-17 MED ORDER — CYCLOBENZAPRINE HCL 10 MG PO TABS: 10.0000 mg | ORAL_TABLET | Freq: Two times a day (BID) | ORAL | Status: DC | PRN

## 2012-08-17 MED ORDER — LORAZEPAM 1 MG PO TABS
1.0000 mg | ORAL_TABLET | Freq: Once | ORAL | Status: AC
  Administered 2012-08-17: 1 mg via ORAL
  Filled 2012-08-17: qty 1

## 2012-08-17 NOTE — ED Provider Notes (Signed)
History   This chart was scribed for non-physician practitioner working with Kimberly Grimes. Kimberly Payor, MD by Kimberly Grimes, ED Scribe. This patient was seen in room WTR6/WTR6 and the patient's care was started at 2037.    CSN: [id number]  Arrival date & time 08/17/12  2037   First MD Initiated Contact with Patient 08/17/12 2103      Chief Complaint  Patient presents with  . Panic Attack     The history is provided by the patient. No language interpreter was used.    Kimberly Grimes is a 42 y.o. female who presents to the Emergency Department complaining of recurring, constant, unchanged panic attack starting PTA. Pt states she has been aching all over the body for about 2 days but the pain has worsened in left wrist today. Pt states her pain is 10/10 currently. Her wrist splint provides relief. She was advised this from PCP after being diagnosed with carpal tunnel. Pt states she has h/o of anxiety attacks with the last episode occuring 1-2 weeks ago. Pt was told she was pre-diabetic about 1-2 weeks ago. Pt has associated anxiousness but denies chest pain, SI, HI, numbness, tingling.   Pt has h/o sickle cell trait, rheumatoid arthritis, muscle spasms of lower extremity.  Pt denies smoking but occasionally uses alcohol.  PCP Dr. Bruna Grimes. Past Medical History  Diagnosis Date  . Anemia 12/11/2011  . Varicose veins   . Sickle cell trait   . Rheumatoid arthritis   . History of blood clots     Blood clots during menstrual cycle  . Low iron   . Muscle spasms of lower extremity     Past Surgical History  Procedure Date  . Tubal ligation 2007    Family History  Problem Relation Age of Onset  . Rheum arthritis Mother     History  Substance Use Topics  . Smoking status: Never Smoker   . Smokeless tobacco: Not on file  . Alcohol Use: Yes     Comment: socially    OB History    Grav Para Term Preterm Abortions TAB SAB Ect Mult Living   4        1 5       Review of Systems  A  complete 10 system review of systems was obtained and all systems are negative except as noted in the HPI and PMH.    Allergies  Decadron and Tylenol  Home Medications   Current Outpatient Rx  Name  Route  Sig  Dispense  Refill  . ALBUTEROL SULFATE HFA 108 (90 BASE) MCG/ACT IN AERS   Inhalation   Inhale 2 puffs into the lungs every 6 (six) hours as needed. For shortness of breath         . CYCLOBENZAPRINE HCL 10 MG PO TABS   Oral   Take 10 mg by mouth 3 (three) times daily as needed. For muscle spasms         . DICLOFENAC SODIUM 75 MG PO TBEC   Oral   Take 75 mg by mouth 2 (two) times daily.         Marland Kitchen LORAZEPAM 1 MG PO TABS   Oral   Take 1 mg by mouth 3 (three) times daily as needed.           BP 154/99  Pulse 99  Temp 98.4 F (36.9 C) (Oral)  Resp 21  Ht 5\' 4"  (1.626 m)  Wt 195 lb (88.451 kg)  BMI 33.47 kg/m2  SpO2 100%  LMP 08/03/2012  Physical Exam  Nursing note and vitals reviewed. Constitutional: She is oriented to person, place, and time. She appears well-developed and well-nourished. No distress.  HENT:  Head: Normocephalic and atraumatic.  Eyes: Conjunctivae normal and EOM are normal.  Neck: Neck supple. No tracheal deviation present.  Cardiovascular: Normal rate, regular rhythm and normal heart sounds.   Pulmonary/Chest: Effort normal. No respiratory distress. She has no wheezes. She has no rales.  Musculoskeletal: Normal range of motion.       Generalized tenderness to the right wrist. Full ROM, no sensory deficits.   Neurological: She is alert and oriented to person, place, and time.  Skin: Skin is warm and dry.  Psychiatric:       Very anxious, fidgety, tearful.    ED Course  Procedures (including critical care time)  DIAGNOSTIC STUDIES: Oxygen Saturation is 100% on room air, normal by my interpretation.    COORDINATION OF CARE:  9:13 PM Discussed treatment plan which includes anxiety medication with pt at bedside and pt agreed to  plan.    Results for orders placed during the hospital encounter of 08/17/12  GLUCOSE, CAPILLARY      Component Value Range   Glucose-Capillary 106 (*) 70 - 99 mg/dL     No results found.   1. Arm pain   2. Panic attack       MDM  42 y/o female with anxiety and wrist pain. Patient very anxious and tearful in ED. Significant other with her became very verbal with nursing staff and ED techs cursing since she is anxious. Ativan given which calmed her down. Flexeril and percocet given for wrist pain. She will f/u with Dr. Bruna Grimes tomorrow.       I personally performed the services described in this documentation, which was scribed in my presence. The recorded information has been reviewed and is accurate.    Kimberly Mace, PA-C 08/17/12 2217

## 2012-08-17 NOTE — ED Notes (Signed)
Pt has hx of pre-diabetes. Checked her blood sugar and found it was 171. Pt began to panic and is very emotional. Pt also c/o pain in her left arm. Denies injury or trauma but states it aches every couple months.

## 2012-08-17 NOTE — ED Provider Notes (Signed)
Medical screening examination/treatment/procedure(s) were performed by non-physician practitioner and as supervising physician I was immediately available for consultation/collaboration.  [name]. Rubin Payor, MD 08/17/12 [phone]

## 2012-09-05 ENCOUNTER — Ambulatory Visit (HOSPITAL_BASED_OUTPATIENT_CLINIC_OR_DEPARTMENT_OTHER): Payer: Medicaid Other | Admitting: Oncology

## 2012-09-05 ENCOUNTER — Telehealth: Payer: Self-pay | Admitting: Oncology

## 2012-09-05 ENCOUNTER — Other Ambulatory Visit (HOSPITAL_BASED_OUTPATIENT_CLINIC_OR_DEPARTMENT_OTHER): Payer: Medicaid Other | Admitting: Lab

## 2012-09-05 ENCOUNTER — Encounter: Payer: Self-pay | Admitting: Oncology

## 2012-09-05 VITALS — BP 133/87 | HR 66 | Temp 98.0°F | Resp 18 | Ht 64.0 in | Wt 197.0 lb

## 2012-09-05 DIAGNOSIS — D649 Anemia, unspecified: Secondary | ICD-10-CM

## 2012-09-05 DIAGNOSIS — N920 Excessive and frequent menstruation with regular cycle: Secondary | ICD-10-CM

## 2012-09-05 DIAGNOSIS — D509 Iron deficiency anemia, unspecified: Secondary | ICD-10-CM

## 2012-09-05 LAB — CBC WITH DIFFERENTIAL/PLATELET
BASO%: 0.5 % (ref 0.0–2.0)
LYMPH%: 22.7 % (ref 14.0–49.7)
MCHC: 34.5 g/dL (ref 31.5–36.0)
MCV: 81.7 fL (ref 79.5–101.0)
MONO%: 6.5 % (ref 0.0–14.0)
Platelets: 209 10*3/uL (ref 145–400)
RBC: 4.26 10*6/uL (ref 3.70–5.45)
RDW: 13.3 % (ref 11.2–14.5)
WBC: 5 10*3/uL (ref 3.9–10.3)

## 2012-09-05 LAB — IRON AND TIBC: Iron: 60 ug/dL (ref 42–145)

## 2012-09-05 LAB — FERRITIN: Ferritin: 40 ng/mL (ref 10–291)

## 2012-09-05 NOTE — Progress Notes (Signed)
Hematology and Oncology Follow Up Visit  Kimberly Grimes [id number] Aug 05, 1970 41 y.o. 09/05/2012 10:36 AM Kimberly Grimes, [name], Kimberly Grimes, *   Principle Diagnosis: 42 year old women with iron deficiency anemia due to heavy menstrual bleeding.    Prior Therapy: Feraheme 1,020 mg IV on 12/12/11 and repeated on 02/22/12.  Current therapy: Watchful observation  Interim History: Kimberly Grimes is a 42 year old female seen for routine follow-up today. She received her last Feraheme infusion in August 2013. No infusion-related reaction. She remains fatigued. Appetite has been increased since the iron infusion. Continues to have heavy periods that last about 1 week. Denies chest pain, shortness of breath, and dyspnea. No abdominal pain, nausea, or vomiting. Denies hematuria and melena. She reported improvement in her symptoms for a while, but now she is noticing some decline in her overall performance status.   Medications: I have reviewed the patient's current medications. Current outpatient prescriptions:albuterol (PROVENTIL HFA;VENTOLIN HFA) 108 (90 BASE) MCG/ACT inhaler, Inhale 2 puffs into the lungs every 6 (six) hours as needed. For shortness of breath, Disp: , Rfl: ;  cyclobenzaprine (FLEXERIL) 10 MG tablet, Take 10 mg by mouth 3 (three) times daily as needed. For muscle spasms, Disp: , Rfl:   Allergies:  Allergies  Allergen Reactions  . Decadron (Dexamethasone)     Vaginal itching and burning reaction.  . Tylenol (Acetaminophen) Other (See Comments)    Unknown reaction    Past Medical History, Surgical history, Social history, and Family History were reviewed and updated.  Review of Systems: Constitutional:  Negative for fever, chills, night sweats, anorexia, weight loss, pain. Cardiovascular: no chest pain or dyspnea on exertion Respiratory: no cough, shortness of breath, or wheezing Neurological: no TIA or stroke symptoms Dermatological: negative ENT: negative Skin:  Negative. Gastrointestinal: no abdominal pain, change in bowel habits, or black or bloody stools Genito-Urinary: no dysuria, trouble voiding, or hematuria Hematological and Lymphatic: negative Breast: negative for breast lumps Musculoskeletal: negative Remaining ROS negative.  Physical Exam: Blood pressure 133/87, pulse 66, temperature 98 F (36.7 C), temperature source Oral, resp. rate 18, height 5\' 4"  (1.626 m), weight 197 lb (89.359 kg), last menstrual period 08/03/2012. ECOG:  General appearance: alert, cooperative and no distress Head: Normocephalic, without obvious abnormality, atraumatic Neck: no adenopathy, no carotid bruit, no JVD, supple, symmetrical, trachea midline and thyroid not enlarged, symmetric, no tenderness/mass/nodules Lymph nodes: Cervical, supraclavicular, and axillary nodes normal. Heart:regular rate and rhythm, S1, S2 normal, no murmur, click, rub or gallop Lung:chest clear, no wheezing, rales, normal symmetric air entry, no tachypnea, retractions or cyanosis Abdomen: soft, non-tender, without masses or organomegaly EXT:no erythema, induration, or nodules   Lab Results: Lab Results  Component Value Date   WBC 5.0 09/05/2012   HGB 12.0 09/05/2012   HCT 34.8 09/05/2012   MCV 81.7 09/05/2012   PLT 209 09/05/2012     Chemistry      Component Value Date/Time   NA 139 02/19/2012 0857   K 3.9 02/19/2012 0857   CL 105 02/19/2012 0857   CO2 24 02/19/2012 0857   BUN 10 02/19/2012 0857   CREATININE 0.80 02/19/2012 0857      Component Value Date/Time   CALCIUM 9.3 02/19/2012 0857   ALKPHOS 62 02/19/2012 0857   AST 14 02/19/2012 0857   ALT 11 02/19/2012 0857   BILITOT 0.2* 02/19/2012 0857     Impression and Plan: This is a 42 year old female with the following issues:  1. Iron deficiency anemia. The  patient has received 2 doses of Feraheme; last one in August 2013. She tolerated the infusion well without any infusion-related toxicity. Her hemoglobin is 12.0 today, but her  Ferritin might still be low and might need more IV Fe. I will check Fe stores today. If her Fe studies continue to show low levels, I will give another Feraheme treatment next week.   2. Follow-up in about 3 months     Kimberly Grimes 2/21/201410:36 AM

## 2012-09-05 NOTE — Telephone Encounter (Signed)
gv and printed appt schedule for pt for May °

## 2012-12-03 ENCOUNTER — Ambulatory Visit: Payer: Medicaid Other | Admitting: Oncology

## 2012-12-03 ENCOUNTER — Other Ambulatory Visit: Payer: Medicaid Other | Admitting: Lab

## 2013-11-22 IMAGING — CR DG CHEST 2V
2 series · 2 of 2 positions shown · non-contrast
Comparison: None.

CLINICAL DATA: Cough, congestion.

CHEST - 2 VIEW

[w chest pa]
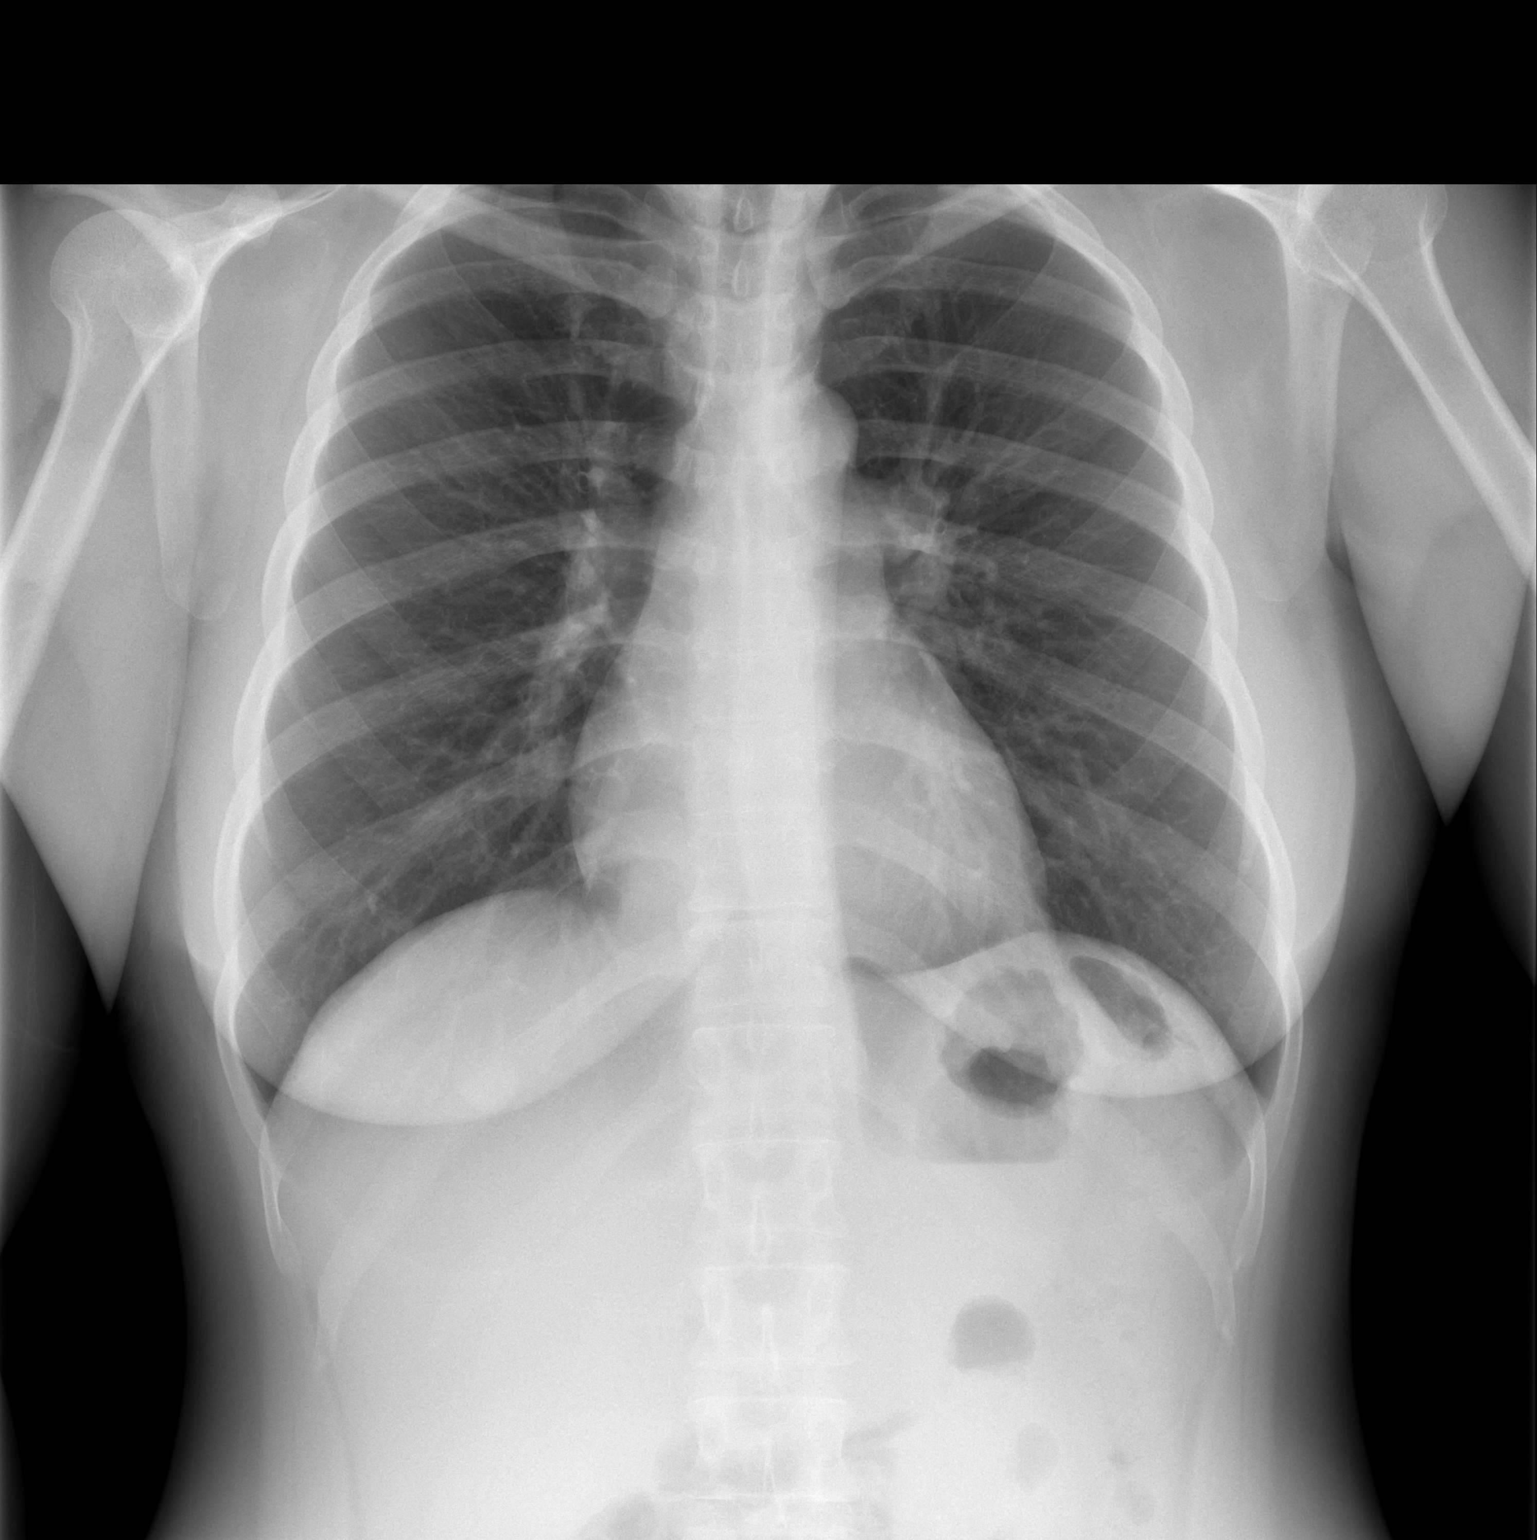

[w chest lat]
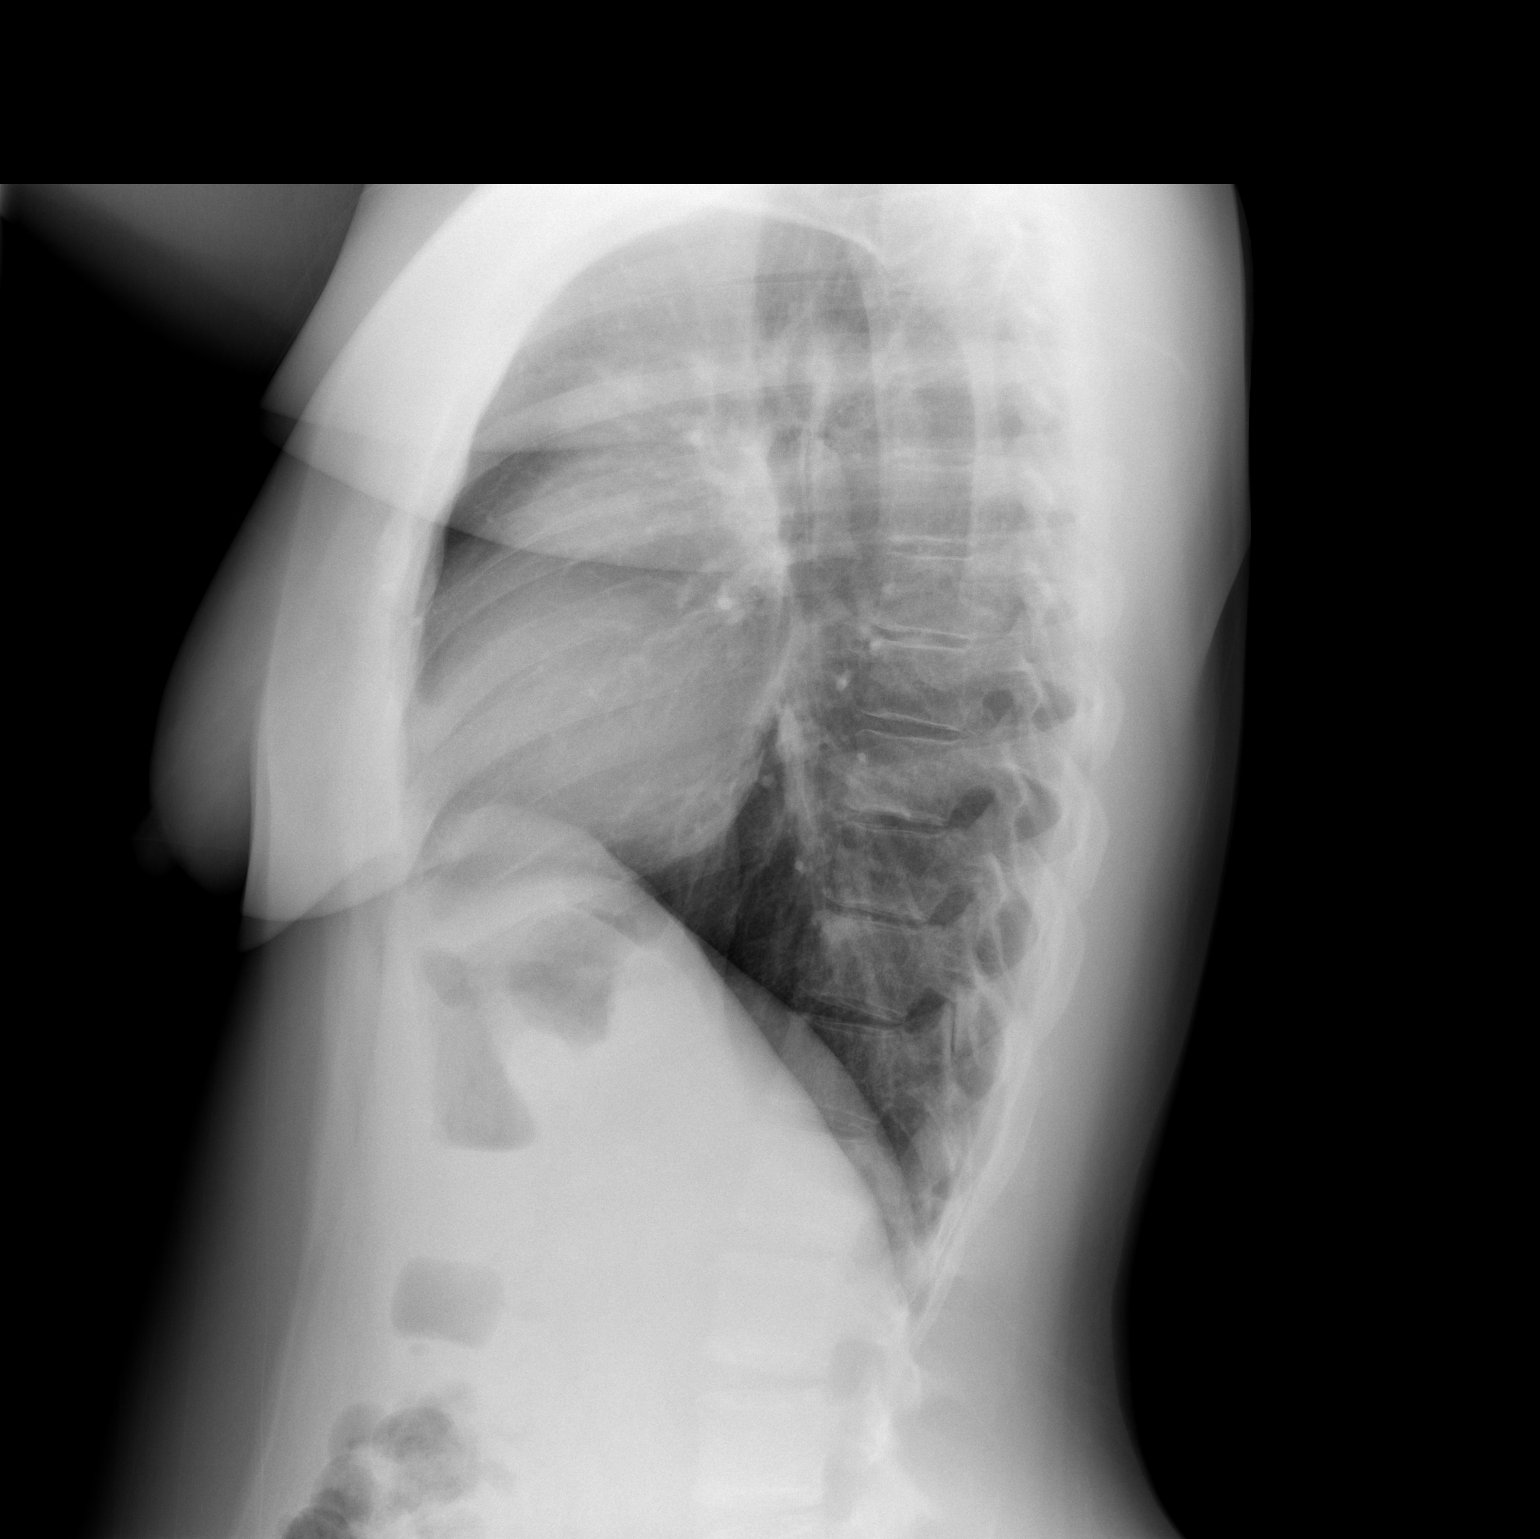

[2 of 2 positions shown; findings below may reference images not displayed]

FINDINGS: Heart and mediastinal contours are within normal limits.
No focal opacities or effusions.  No acute bony abnormality.
IMPRESSION: No active cardiopulmonary disease.

## 2014-01-13 ENCOUNTER — Emergency Department (HOSPITAL_COMMUNITY): Payer: Medicaid Other

## 2014-01-13 ENCOUNTER — Encounter (HOSPITAL_COMMUNITY): Payer: Self-pay | Admitting: Emergency Medicine

## 2014-01-13 ENCOUNTER — Observation Stay (HOSPITAL_COMMUNITY)
Admission: EM | Admit: 2014-01-13 | Discharge: 2014-01-14 | Disposition: A | Discharge status: Home / Self Care | Payer: Medicaid Other | Attending: Internal Medicine | Admitting: Internal Medicine

## 2014-01-13 DIAGNOSIS — D573 Sickle-cell trait: Secondary | ICD-10-CM | POA: Diagnosis not present

## 2014-01-13 DIAGNOSIS — F410 Panic disorder [episodic paroxysmal anxiety] without agoraphobia: Secondary | ICD-10-CM | POA: Insufficient documentation

## 2014-01-13 DIAGNOSIS — M2550 Pain in unspecified joint: Secondary | ICD-10-CM | POA: Diagnosis present

## 2014-01-13 DIAGNOSIS — D509 Iron deficiency anemia, unspecified: Secondary | ICD-10-CM | POA: Diagnosis not present

## 2014-01-13 DIAGNOSIS — Z79899 Other long term (current) drug therapy: Secondary | ICD-10-CM | POA: Insufficient documentation

## 2014-01-13 DIAGNOSIS — N83209 Unspecified ovarian cyst, unspecified side: Secondary | ICD-10-CM | POA: Diagnosis not present

## 2014-01-13 DIAGNOSIS — R030 Elevated blood-pressure reading, without diagnosis of hypertension: Principal | ICD-10-CM | POA: Insufficient documentation

## 2014-01-13 DIAGNOSIS — A599 Trichomoniasis, unspecified: Secondary | ICD-10-CM | POA: Diagnosis not present

## 2014-01-13 DIAGNOSIS — Z8739 Personal history of other diseases of the musculoskeletal system and connective tissue: Secondary | ICD-10-CM

## 2014-01-13 DIAGNOSIS — M069 Rheumatoid arthritis, unspecified: Secondary | ICD-10-CM | POA: Diagnosis not present

## 2014-01-13 DIAGNOSIS — R5383 Other fatigue: Secondary | ICD-10-CM

## 2014-01-13 DIAGNOSIS — R011 Cardiac murmur, unspecified: Secondary | ICD-10-CM | POA: Insufficient documentation

## 2014-01-13 DIAGNOSIS — M722 Plantar fascial fibromatosis: Secondary | ICD-10-CM | POA: Diagnosis not present

## 2014-01-13 DIAGNOSIS — D259 Leiomyoma of uterus, unspecified: Secondary | ICD-10-CM | POA: Insufficient documentation

## 2014-01-13 DIAGNOSIS — R5381 Other malaise: Secondary | ICD-10-CM | POA: Insufficient documentation

## 2014-01-13 DIAGNOSIS — M62838 Other muscle spasm: Secondary | ICD-10-CM | POA: Diagnosis not present

## 2014-01-13 DIAGNOSIS — Z9289 Personal history of other medical treatment: Secondary | ICD-10-CM

## 2014-01-13 DIAGNOSIS — Z8742 Personal history of other diseases of the female genital tract: Secondary | ICD-10-CM

## 2014-01-13 DIAGNOSIS — N920 Excessive and frequent menstruation with regular cycle: Secondary | ICD-10-CM

## 2014-01-13 DIAGNOSIS — D500 Iron deficiency anemia secondary to blood loss (chronic): Secondary | ICD-10-CM

## 2014-01-13 DIAGNOSIS — R531 Weakness: Secondary | ICD-10-CM

## 2014-01-13 DIAGNOSIS — D649 Anemia, unspecified: Secondary | ICD-10-CM

## 2014-01-13 HISTORY — DX: Cardiac murmur, unspecified: R01.1

## 2014-01-13 HISTORY — DX: Personal history of other medical treatment: Z92.89

## 2014-01-13 HISTORY — DX: Unspecified osteoarthritis, unspecified site: M19.90

## 2014-01-13 HISTORY — DX: Iron deficiency anemia, unspecified: D50.9

## 2014-01-13 LAB — URINALYSIS, ROUTINE W REFLEX MICROSCOPIC
Bilirubin Urine: NEGATIVE
Glucose, UA: NEGATIVE mg/dL
Hgb urine dipstick: NEGATIVE
KETONES UR: NEGATIVE mg/dL
NITRITE: NEGATIVE
PROTEIN: NEGATIVE mg/dL
Specific Gravity, Urine: 1.019 (ref 1.005–1.030)
UROBILINOGEN UA: 0.2 mg/dL (ref 0.0–1.0)
pH: 5.5 (ref 5.0–8.0)

## 2014-01-13 LAB — CBC WITH DIFFERENTIAL/PLATELET
BASOS ABS: 0 10*3/uL (ref 0.0–0.1)
Basophils Relative: 0 % (ref 0–1)
EOS ABS: 0.1 10*3/uL (ref 0.0–0.7)
Eosinophils Relative: 2 % (ref 0–5)
HCT: 22.1 % — ABNORMAL LOW (ref 36.0–46.0)
Hemoglobin: 6.7 g/dL — CL (ref 12.0–15.0)
LYMPHS PCT: 26 % (ref 12–46)
Lymphs Abs: 1.4 10*3/uL (ref 0.7–4.0)
MCH: 16.1 pg — ABNORMAL LOW (ref 26.0–34.0)
MCHC: 30.3 g/dL (ref 30.0–36.0)
MCV: 53.1 fL — ABNORMAL LOW (ref 78.0–100.0)
MONOS PCT: 7 % (ref 3–12)
Monocytes Absolute: 0.4 10*3/uL (ref 0.1–1.0)
Neutro Abs: 3.6 10*3/uL (ref 1.7–7.7)
Neutrophils Relative %: 65 % (ref 43–77)
PLATELETS: 349 10*3/uL (ref 150–400)
RBC: 4.16 MIL/uL (ref 3.87–5.11)
RDW: 19.2 % — AB (ref 11.5–15.5)
WBC: 5.5 10*3/uL (ref 4.0–10.5)

## 2014-01-13 LAB — COMPREHENSIVE METABOLIC PANEL
ALBUMIN: 3.4 g/dL — AB (ref 3.5–5.2)
ALK PHOS: 92 U/L (ref 39–117)
ALT: 8 U/L (ref 0–35)
ANION GAP: 15 (ref 5–15)
AST: 20 U/L (ref 0–37)
BUN: 17 mg/dL (ref 6–23)
CHLORIDE: 102 meq/L (ref 96–112)
CO2: 22 mEq/L (ref 19–32)
CREATININE: 0.77 mg/dL (ref 0.50–1.10)
Calcium: 8.7 mg/dL (ref 8.4–10.5)
GFR calc Af Amer: >90 mL/min (ref >90)
GFR calc non Af Amer: >90 mL/min (ref >90)
Glucose, Bld: 103 mg/dL — ABNORMAL HIGH (ref 70–99)
POTASSIUM: 3.7 meq/L (ref 3.7–5.3)
Sodium: 139 mEq/L (ref 137–147)
TOTAL PROTEIN: 8.2 g/dL (ref 6.0–8.3)

## 2014-01-13 LAB — URINE MICROSCOPIC-ADD ON

## 2014-01-13 LAB — ABO/RH: ABO/RH(D): AB NEG

## 2014-01-13 LAB — SEDIMENTATION RATE: Sed Rate: 35 mm/hr — ABNORMAL HIGH (ref 0–22)

## 2014-01-13 LAB — PREPARE RBC (CROSSMATCH)

## 2014-01-13 LAB — CBG MONITORING, ED: Glucose-Capillary: 105 mg/dL — ABNORMAL HIGH (ref 70–99)

## 2014-01-13 MED ORDER — ONDANSETRON 4 MG PO TBDP
8.0000 mg | ORAL_TABLET | Freq: Once | ORAL | Status: AC
  Administered 2014-01-13: 8 mg via ORAL
  Filled 2014-01-13: qty 2

## 2014-01-13 MED ORDER — OXYCODONE-ACETAMINOPHEN 5-325 MG PO TABS
2.0000 | ORAL_TABLET | Freq: Once | ORAL | Status: AC
  Administered 2014-01-13: 2 via ORAL
  Filled 2014-01-13: qty 2

## 2014-01-13 NOTE — ED Notes (Signed)
Pt c/o generalized body aches and joint pain x several weeks intermittently; pt denies fever of obvious injury

## 2014-01-13 NOTE — ED Provider Notes (Signed)
CSN: [id number]     Arrival date & time 01/13/14  1804 History  This chart was scribed for Kimberly Burke, PA-C working with Orpah Greek, MD by Randa Evens, ED Scribe. This patient was seen in room TR05C/TR05C and the patient's care was started at 7:09 PM.     Chief Complaint  Patient presents with  . Generalized Body Aches   The history is provided by the patient. No language interpreter was used.   HPI Comments: Kimberly Grimes is a 43 y.o. female who presents to the Emergency Department complaining of weakness onset 3 weeks prior. She states she has associated generalized body aches and frequency urinating. She states she has a history of Rheumatoid arthritis. She states that her feet started swelling, and her wrist and fingers have been locking up intermittently since December. She states that these flare ups are normal to her rheumatoid arthritis, but these past few weeks have been different with profound weakness. She has worsening dyspnea on exertion. She has history of anemia needing iron transfusions, but has never needed a blood transfusion. She believes her anemia is coming from heavy menstrual cycles. This has been a problem for many years. She has seen GYN in 2013, but did not have a hysterectomy. She is not having any currently vaginal bleeding. Her LMP was approximately 5 days ago. She denies, fever, nausea, vomiting, chest pain, or chills.  Past Medical History  Diagnosis Date  . Anemia 12/11/2011  . Varicose veins   . Sickle cell trait   . Rheumatoid arthritis(714.0)   . History of blood clots     Blood clots during menstrual cycle  . Low iron   . Muscle spasms of lower extremity   . Panic attack    Past Surgical History  Procedure Laterality Date  . Tubal ligation  2007   Family History  Problem Relation Age of Onset  . Rheum arthritis Mother    History  Substance Use Topics  . Smoking status: Never Smoker   . Smokeless tobacco: Not on file  .  Alcohol Use: Yes     Comment: socially   OB History   Grav Para Term Preterm Abortions TAB SAB Ect Mult Living   4        1 5      Review of Systems  Constitutional: Positive for fatigue. Negative for fever and chills.  Respiratory: Positive for shortness of breath (DOE).   Cardiovascular: Negative for chest pain.  Gastrointestinal: Negative for nausea and vomiting.  Genitourinary: Positive for frequency.  Musculoskeletal: Positive for arthralgias and myalgias.  Neurological: Positive for weakness.  All other systems reviewed and are negative.     Allergies  Decadron and Tylenol  Home Medications   Prior to Admission medications   Medication Sig Start Date End Date Taking? Authorizing Provider  albuterol (PROVENTIL HFA;VENTOLIN HFA) 108 (90 BASE) MCG/ACT inhaler Inhale 2 puffs into the lungs every 6 (six) hours as needed. For shortness of breath   Yes Historical Provider, MD  cyclobenzaprine (FLEXERIL) 10 MG tablet Take 10 mg by mouth 3 (three) times daily as needed. For muscle spasms   Yes Historical Provider, MD  ibuprofen (ADVIL,MOTRIN) 200 MG tablet Take 200 mg by mouth every 6 (six) hours as needed for moderate pain.   Yes Historical Provider, MD   Triage Vitals: BP 155/75  Pulse 84  Temp(Src) 98.6 F (37 C) (Oral)  Resp 18  SpO2 100%  Physical Exam  Nursing note and vitals reviewed.  Constitutional: She is oriented to person, place, and time. She appears well-developed and well-nourished. No distress.  HENT:  Head: Normocephalic and atraumatic.  Right Ear: External ear normal.  Left Ear: External ear normal.  Nose: Nose normal.  Mouth/Throat: Oropharynx is clear and moist.  Eyes: Conjunctivae are normal.  Neck: Normal range of motion.  Cardiovascular: Normal rate, regular rhythm and normal heart sounds.   Pulmonary/Chest: Effort normal and breath sounds normal. No stridor. No respiratory distress. She has no wheezes. She has no rales.  Abdominal: Soft. She  exhibits no distension. There is no tenderness.  Musculoskeletal: Normal range of motion.  Tenderness to palpation over hands and feet. Right hand > left, left foot > right. No erythema surrounding any of her joints.  Neurological: She is alert and oriented to person, place, and time. She has normal strength.  Skin: Skin is warm and dry. She is not diaphoretic. No erythema.  Psychiatric: She has a normal mood and affect. Her behavior is normal.    ED Course  Procedures (including critical care time) DIAGNOSTIC STUDIES: Oxygen Saturation is 100% on RA, normal by my interpretation.    COORDINATION OF CARE:    Labs Review Labs Reviewed  URINALYSIS, ROUTINE W REFLEX MICROSCOPIC - Abnormal; Notable for the following:    APPearance CLOUDY (*)    Leukocytes, UA LARGE (*)    All other components within normal limits  CBC WITH DIFFERENTIAL - Abnormal; Notable for the following:    Hemoglobin 6.7 (*)    HCT 22.1 (*)    MCV 53.1 (*)    MCH 16.1 (*)    RDW 19.2 (*)    All other components within normal limits  COMPREHENSIVE METABOLIC PANEL - Abnormal; Notable for the following:    Glucose, Bld 103 (*)    Albumin 3.4 (*)    Total Bilirubin <0.2 (*)    All other components within normal limits  SEDIMENTATION RATE - Abnormal; Notable for the following:    Sed Rate 35 (*)    All other components within normal limits  URINE MICROSCOPIC-ADD ON - Abnormal; Notable for the following:    Squamous Epithelial / LPF MANY (*)    Bacteria, UA FEW (*)    All other components within normal limits  CBG MONITORING, ED - Abnormal; Notable for the following:    Glucose-Capillary 105 (*)    All other components within normal limits  URINE CULTURE  FERRITIN  FOLATE  IRON AND TIBC  RETICULOCYTES  VITAMIN B12  TYPE AND SCREEN  PREPARE RBC (CROSSMATCH)  ABO/RH    Imaging Review Dg Chest 2 View  01/13/2014   CLINICAL DATA:  Three weeks of body aches and weakness.  EXAM: CHEST  2 VIEW  COMPARISON:   PA and lateral chest of Nov 23, 2011.  FINDINGS: The lungs are well-expanded and clear. The heart and mediastinal structures are within the limits of normal. There is moderate gaseous distention of bowel loops under the left hemidiaphragm. The bony thorax is unremarkable.  IMPRESSION: There is no acute cardiopulmonary disease. There is gaseous distention of bowel loops in the upper abdomen.   Electronically Signed   By: David  Martinique   On: 01/13/2014 21:00     EKG Interpretation None      MDM   Final diagnoses:  Symptomatic anemia    Patient presents emergency department chief complaint of generalized weakness and myalgias. Patient does have history of rheumatoid arthritis. This feels similar, but different. Labs were  done and the patient was found to have a hemoglobin of 6.7. Given her generalized weakness, worsening dyspnea on exertion will transfuse for symptomatic anemia. History of heavy vaginal bleeding. Her last menstrual cycle ended approximately 5 days ago. She has been seen by GYN in the past, but never had a hysterectomy. She is not actively bleeding. Discuss this case with Dr. Hal Hope who agrees to admission. Patient is hemodynamically stable. Admission is appreciated. Dr. Betsey Holiday evaluated patient and agrees with plan. Patient / Family / Caregiver informed of clinical course, understand medical decision-making process, and agree with plan.  I personally performed the services described in this documentation, which was scribed in my presence. The recorded information has been reviewed and is accurate.     Elwyn Lade, PA-C 01/13/14 2344

## 2014-01-13 NOTE — ED Notes (Signed)
Report to receiving RN on 5. Pt to go to floor on stretcher.

## 2014-01-13 NOTE — ED Notes (Signed)
Pt stated that she has a hx of muscle spasms and arthritis all over body and which has been going on x 1 year. Pt also stated that she has been urinating 4-5 time at night and generalized body aches for 1 week and thought she had the flu at one time but did not think so because it has been a week. Denies any n/v/d.

## 2014-01-13 NOTE — Progress Notes (Signed)
Received report from RN in ED.

## 2014-01-13 NOTE — ED Notes (Signed)
Patient transported to X-ray 

## 2014-01-14 ENCOUNTER — Observation Stay (HOSPITAL_COMMUNITY): Payer: Medicaid Other

## 2014-01-14 ENCOUNTER — Encounter (HOSPITAL_COMMUNITY): Payer: Self-pay | Admitting: General Practice

## 2014-01-14 DIAGNOSIS — Z8739 Personal history of other diseases of the musculoskeletal system and connective tissue: Secondary | ICD-10-CM

## 2014-01-14 DIAGNOSIS — R531 Weakness: Secondary | ICD-10-CM | POA: Diagnosis present

## 2014-01-14 DIAGNOSIS — Z8742 Personal history of other diseases of the female genital tract: Secondary | ICD-10-CM

## 2014-01-14 DIAGNOSIS — D509 Iron deficiency anemia, unspecified: Secondary | ICD-10-CM | POA: Diagnosis present

## 2014-01-14 DIAGNOSIS — M722 Plantar fascial fibromatosis: Secondary | ICD-10-CM | POA: Diagnosis present

## 2014-01-14 DIAGNOSIS — A599 Trichomoniasis, unspecified: Secondary | ICD-10-CM | POA: Diagnosis present

## 2014-01-14 DIAGNOSIS — R609 Edema, unspecified: Secondary | ICD-10-CM

## 2014-01-14 LAB — FOLATE: Folate: >20 ng/mL

## 2014-01-14 LAB — BASIC METABOLIC PANEL
Anion gap: 13 (ref 5–15)
BUN: 12 mg/dL (ref 6–23)
CHLORIDE: 105 meq/L (ref 96–112)
CO2: 23 meq/L (ref 19–32)
Calcium: 8.4 mg/dL (ref 8.4–10.5)
Creatinine, Ser: 0.65 mg/dL (ref 0.50–1.10)
GFR calc Af Amer: >90 mL/min (ref >90)
GFR calc non Af Amer: >90 mL/min (ref >90)
GLUCOSE: 89 mg/dL (ref 70–99)
POTASSIUM: 3.4 meq/L — AB (ref 3.7–5.3)
SODIUM: 141 meq/L (ref 137–147)

## 2014-01-14 LAB — IRON AND TIBC: UIBC: 378 ug/dL (ref 125–400)

## 2014-01-14 LAB — CBC
HCT: 25.1 % — ABNORMAL LOW (ref 36.0–46.0)
HEMOGLOBIN: 7.7 g/dL — AB (ref 12.0–15.0)
MCH: 17.4 pg — AB (ref 26.0–34.0)
MCHC: 30.7 g/dL (ref 30.0–36.0)
MCV: 56.7 fL — AB (ref 78.0–100.0)
Platelets: 307 10*3/uL (ref 150–400)
RBC: 4.43 MIL/uL (ref 3.87–5.11)
RDW: 23.2 % — ABNORMAL HIGH (ref 11.5–15.5)
WBC: 6.5 10*3/uL (ref 4.0–10.5)

## 2014-01-14 LAB — FERRITIN: FERRITIN: 4 ng/mL — AB (ref 10–291)

## 2014-01-14 LAB — PREGNANCY, URINE: Preg Test, Ur: NEGATIVE

## 2014-01-14 LAB — VITAMIN B12: Vitamin B-12: 461 pg/mL (ref 211–911)

## 2014-01-14 LAB — RETICULOCYTES
RBC.: 4.18 MIL/uL (ref 3.87–5.11)
Retic Count, Absolute: 41.8 10*3/uL (ref 19.0–186.0)
Retic Ct Pct: 1 % (ref 0.4–3.1)

## 2014-01-14 LAB — URIC ACID: Uric Acid, Serum: 4.4 mg/dL (ref 2.4–7.0)

## 2014-01-14 MED ORDER — ONDANSETRON HCL 4 MG PO TABS: 4.0000 mg | ORAL_TABLET | Freq: Four times a day (QID) | ORAL | Status: DC | PRN

## 2014-01-14 MED ORDER — FERROUS SULFATE 325 (65 FE) MG PO TABS: 325.0000 mg | ORAL_TABLET | Freq: Two times a day (BID) | ORAL | Status: DC

## 2014-01-14 MED ORDER — SODIUM CHLORIDE 0.9 % IV SOLN
1000.0000 mg | Freq: Once | INTRAVENOUS | Status: AC
  Administered 2014-01-14: 1000 mg via INTRAVENOUS
  Filled 2014-01-14 (×2): qty 20

## 2014-01-14 MED ORDER — POTASSIUM CHLORIDE CRYS ER 20 MEQ PO TBCR
40.0000 meq | EXTENDED_RELEASE_TABLET | Freq: Once | ORAL | Status: AC
  Administered 2014-01-14: 40 meq via ORAL
  Filled 2014-01-14: qty 2

## 2014-01-14 MED ORDER — ONDANSETRON HCL 4 MG/2ML IJ SOLN: 4.0000 mg | Freq: Four times a day (QID) | INTRAMUSCULAR | Status: DC | PRN

## 2014-01-14 MED ORDER — HEPARIN SODIUM (PORCINE) 5000 UNIT/ML IJ SOLN
5000.0000 [IU] | Freq: Three times a day (TID) | INTRAMUSCULAR | Status: DC
  Filled 2014-01-14 (×3): qty 1

## 2014-01-14 MED ORDER — CYCLOBENZAPRINE HCL 10 MG PO TABS: 10.0000 mg | ORAL_TABLET | Freq: Three times a day (TID) | ORAL | Status: DC | PRN

## 2014-01-14 MED ORDER — METRONIDAZOLE 500 MG PO TABS: 500.0000 mg | ORAL_TABLET | Freq: Two times a day (BID) | ORAL | Status: DC

## 2014-01-14 MED ORDER — NAPROXEN 375 MG PO TABS
375.0000 mg | ORAL_TABLET | Freq: Two times a day (BID) | ORAL | Status: DC
  Administered 2014-01-14 (×2): 375 mg via ORAL
  Filled 2014-01-14 (×3): qty 1

## 2014-01-14 MED ORDER — METRONIDAZOLE 500 MG PO TABS
500.0000 mg | ORAL_TABLET | Freq: Two times a day (BID) | ORAL | Status: DC
  Administered 2014-01-14 (×2): 500 mg via ORAL
  Filled 2014-01-14 (×3): qty 1

## 2014-01-14 MED ORDER — ALBUTEROL SULFATE (2.5 MG/3ML) 0.083% IN NEBU: 2.5000 mg | INHALATION_SOLUTION | Freq: Four times a day (QID) | RESPIRATORY_TRACT | Status: DC | PRN

## 2014-01-14 MED ORDER — SODIUM CHLORIDE 0.9 % IV SOLN
INTRAVENOUS | Status: DC
  Administered 2014-01-14: 03:00:00 via INTRAVENOUS

## 2014-01-14 MED ORDER — NAPROXEN 375 MG PO TABS: 375.0000 mg | ORAL_TABLET | Freq: Two times a day (BID) | ORAL | Status: DC

## 2014-01-14 MED ORDER — OXYCODONE HCL 5 MG PO TABS: 5.0000 mg | ORAL_TABLET | ORAL | Status: DC | PRN

## 2014-01-14 MED ORDER — SODIUM CHLORIDE 0.9 % IV SOLN
25.0000 mg | Freq: Once | INTRAVENOUS | Status: AC
  Administered 2014-01-14: 25 mg via INTRAVENOUS
  Filled 2014-01-14: qty 0.5

## 2014-01-14 NOTE — Progress Notes (Signed)
Pt arrived to unit alert and oriented x4. Oriented to room, unit, and staff.  Bed in lowest position and call bell is within reach. Will continue to monitor. 

## 2014-01-14 NOTE — Discharge Summary (Signed)
Physician Discharge Summary  Kimberly Grimes OPF:[id number] DOB: 1970/08/06 DOA: 01/13/2014  PCP: Elizabeth Palau, MD  Admit date: 01/13/2014 Discharge date: 01/14/2014  Time spent: 60 minutes  Recommendations for Outpatient Follow-up:  1. PCP to reestablish care 2. [place] for Anemia follow-up 3. Please refer toOB/GYN for management of menorrhagia 4. Rheumatology for management of Rheumatoid Arthritis  5. Please repeat Transvaginal Ultrasound with Doppler imaging in 6-8 weeks for evaluation of complex ovarian cyst  Discharge Diagnoses:  Active Problems:   Arthralgia   History of rheumatoid arthritis   Plantar fasciitis, bilateral   H/O: menorrhagia   Weakness   Trichomoniasis   Microcytic anemia   Discharge Condition: Stable  Diet recommendation: General  Filed Weights   01/13/14 2229  Weight: 77.7 kg (171 lb 4.8 oz)    History of present illness:  43 y.o female with h/o rheumatoid arthritis and Iron deficiency anemia secondary to menorrhagia presented with bilateral foot/ankle pain, right worse than left. Pt reports receiving steroid injections prn in knees and ankles 3-5 years ago and has recently moved back to [place] to reestablish herself with her healthcare providers. Pt also reported generalized weakness and was found to have a Hgb 6.7. She received one transfusion in ED and her Hgb improved to 7.7. Urine microscopy revealed Trichomoniasis for which she started Flagyl. She was admitted to [facility] overnight to monitor anemia and joint pains.   Hospital Course:  Weakness Secondary to anemia and h/o rheumatoid arthritis See management below  Microcytic Anemia H/o Iron deficiency anemia and menorrhagia previously managed at [facility] for IV Iron and OB/GYN for menorrhagia management Hgb 6.7 on admission, 1 unit PRBCs, stable 7.7 at discharge. Patient not SOB on ambulation today, feels better IV Iron today, d/c with Ferrous Sulfate supplement  F/u with [facility] on 7/21 for further ongoing care  H/o Menorrhagia Transvaginal and complete pelvic US shows fibroid uterus. F/u with OB/GYN-have asked patient to get referral from PCP.  Complex ovarian cysts - Per radiology, likely benign.This was Seen on pelvic/transvaginal ultrasound, recommendations for followup with Transvaginal Ultrasound with Doppler imaging in 6-8 weeks.  Plantar Fasciitis  Tenderness at heel, pain with dorsiflexion, Per pt, pain worst in morning and night after WB Recommended Ice, gentle massage, OTC antiinflammatories   Arthralgia and H/o Rheumatoid Arthritis Diagnosed 5 years ago, received periodic steroid injections in lower extremities Main joints affected include hands, ankles, and knees, all bilaterally.  ESR elevated Pt has not seen provider for this recently, would like to reestablish with Rheumatologist   Trichomoniasis Present on urine microscopy Rx Flagyl, pt advised partner needs treatment  Procedures:  Pelvic and Transvaginal US  Consultations:  None  Discharge Exam: Filed Vitals:   01/14/14 0449  BP: 120/75  Pulse: 73  Temp: 98.7 F (37.1 C)  Resp: 18    General: WDWN, NAD sitting up in bed. Pleasant and cooperative to exam.  HEENT: Normocephalic, atraumatic. EOMI, anicteric sclera.  Cardiovascular: RRR, S1, S2 auscultated, mild systolic murmur, no rubs or gallops. Respiratory: Normal Respiratory effort. Symmetric chest rise. CTAB. Abdomen: Soft, non tender, non distended, positive bowel sounds.  Extremities: non cyanotic, no rash. Warm. Very mild swelling of right lateral malleolus.  Neuro: AAOx3. CN II-XII grossly intact. Pt moves all four extremities with smoothness and coordination.       Discharge Instructions   Call MD for:  extreme fatigue    Complete by:  As directed      Call MD for:  persistant dizziness or light-headedness    Complete by:  As directed      Diet general    Complete by:  As directed      Increase  activity slowly    Complete by:  As directed             Medication List         albuterol 108 (90 BASE) MCG/ACT inhaler  Commonly known as:  PROVENTIL HFA;VENTOLIN HFA  Inhale 2 puffs into the lungs every 6 (six) hours as needed. For shortness of breath     cyclobenzaprine 10 MG tablet  Commonly known as:  FLEXERIL  Take 10 mg by mouth 3 (three) times daily as needed. For muscle spasms     ferrous sulfate 325 (65 FE) MG tablet  Take 1 tablet (325 mg total) by mouth 2 (two) times daily with a meal.     ibuprofen 200 MG tablet  Commonly known as:  ADVIL,MOTRIN  Take 200 mg by mouth every 6 (six) hours as needed for moderate pain.     metroNIDAZOLE 500 MG tablet  Commonly known as:  FLAGYL  Take 1 tablet (500 mg total) by mouth every 12 (twelve) hours.     naproxen 375 MG tablet  Commonly known as:  NAPROSYN  Take 1 tablet (375 mg total) by mouth 2 (two) times daily with a meal.       Allergies  Allergen Reactions  . Decadron [Dexamethasone]     Vaginal itching and burning reaction.  . Tylenol [Acetaminophen] Other (See Comments)    Unknown reaction   Follow-up Information   Follow up with [facility], MD On 02/02/2014. (appt at 1:30 pm)    Specialty:  Oncology   Contact information:   [address] [place] [address] [phone]       Follow up with Elizabeth Palau, MD. Schedule an appointment as soon as possible for a visit in 1 week.   Specialty:  Family Medicine   Contact information:   [address] [place] [id number] [phone]      Significant Diagnostic Studies: Dg Chest 2 View  01/13/2014   CLINICAL DATA:  Three weeks of body aches and weakness.  EXAM: CHEST  2 VIEW  COMPARISON:  PA and lateral chest of Nov 23, 2011.  FINDINGS: The lungs are well-expanded and clear. The heart and mediastinal structures are within the limits of normal. There is moderate gaseous distention of bowel loops under the left hemidiaphragm. The  bony thorax is unremarkable.  IMPRESSION: There is no acute cardiopulmonary disease. There is gaseous distention of bowel loops in the upper abdomen.   Electronically Signed   By: David  Martinique   On: 01/13/2014 21:00    Labs: Basic Metabolic Panel:  Recent Labs Lab 01/13/14 1955 01/14/14 0418  NA 139 141  K 3.7 3.4*  CL 102 105  CO2 22 23  GLUCOSE 103* 89  BUN 17 12  CREATININE 0.77 0.65  CALCIUM 8.7 8.4   Liver Function Tests:  Recent Labs Lab 01/13/14 1955  AST 20  ALT 8  ALKPHOS 92  BILITOT <0.2*  PROT 8.2  ALBUMIN 3.4*   CBC:  Recent Labs Lab 01/13/14 1955 01/14/14 0418  WBC 5.5 6.5  NEUTROABS 3.6  --   HGB 6.7* 7.7*  HCT 22.1* 25.1*  MCV 53.1* 56.7*  PLT 349 307   CBG:  Recent Labs Lab 01/13/14 1901  GLUCAP 105*  Signed:  Martinique Hausladen, PA-S [facility] 01/14/2014, 10:53 AM  Attending Patient was seen, examined,treatment plan was discussed with the Physician extender. I have directly reviewed the clinical findings, lab, imaging studies and management of this patient in detail. I have made the necessary changes to the above noted documentation, and agree with the documentation, as recorded by the Physician extender.  Nena Alexander MD [facility].

## 2014-01-14 NOTE — Progress Notes (Signed)
NURSING PROGRESS NOTE  Kimberly Grimes [id number] Discharge Data: 01/14/2014 7:33 PM Attending Provider: Jonetta Osgood, MD HYW:[id number],YIRSW Evette Doffing, MD     Gwyneth Sprout to be D/C'd Home per MD order.  Discussed with the patient the After Visit Summary and all questions fully answered. All IV's discontinued with no bleeding noted. All belongings returned to patient for patient to take home.   Last Vital Signs:  Blood pressure 134/77, pulse 76, temperature 99.2 F (37.3 C), temperature source Oral, resp. rate 20, weight 77.7 kg (171 lb 4.8 oz), last menstrual period 01/03/2014, SpO2 99.00%.  Discharge Medication List   Medication List         albuterol 108 (90 BASE) MCG/ACT inhaler  Commonly known as:  PROVENTIL HFA;VENTOLIN HFA  Inhale 2 puffs into the lungs every 6 (six) hours as needed. For shortness of breath     cyclobenzaprine 10 MG tablet  Commonly known as:  FLEXERIL  Take 10 mg by mouth 3 (three) times daily as needed. For muscle spasms     ferrous sulfate 325 (65 FE) MG tablet  Take 1 tablet (325 mg total) by mouth 2 (two) times daily with a meal.     ibuprofen 200 MG tablet  Commonly known as:  ADVIL,MOTRIN  Take 200 mg by mouth every 6 (six) hours as needed for moderate pain.     metroNIDAZOLE 500 MG tablet  Commonly known as:  FLAGYL  Take 1 tablet (500 mg total) by mouth every 12 (twelve) hours.     naproxen 375 MG tablet  Commonly known as:  NAPROSYN  Take 1 tablet (375 mg total) by mouth 2 (two) times daily with a meal.

## 2014-01-14 NOTE — Progress Notes (Signed)
VASCULAR LAB PRELIMINARY  PRELIMINARY  PRELIMINARY  PRELIMINARY  Bilateral lower extremity venous Dopplers completed.    Preliminary report:  There is no DVT or SVT noted in the bilateral lower extremities.   Amore Grater, RVT 01/14/2014, 12:04 PM

## 2014-01-14 NOTE — Progress Notes (Signed)
MEDICATION RELATED CONSULT NOTE - INITIAL   Pharmacy Consult for IV Iron Replacement Indication: anemia  Allergies  Allergen Reactions  . Decadron [Dexamethasone]     Vaginal itching and burning reaction.  . Tylenol [Acetaminophen] Other (See Comments)    Unknown reaction    Patient Measurements: Weight: 171 lb 4.8 oz (77.7 kg)  Vital Signs: Temp: 98.7 F (37.1 C) (07/02 0449) Temp src: Oral (07/02 0449) BP: 120/75 mmHg (07/02 0449) Pulse Rate: 73 (07/02 0449) Intake/Output from previous day: 07/01 0701 - 07/02 0700 In: 312.5 [Blood:312.5] Out: -  Intake/Output from this shift:    Labs:  Recent Labs  01/13/14 1955 01/14/14 0418  WBC 5.5 6.5  HGB 6.7* 7.7*  HCT 22.1* 25.1*  PLT 349 307  CREATININE 0.77 0.65  ALBUMIN 3.4*  --   PROT 8.2  --   AST 20  --   ALT 8  --   ALKPHOS 92  --   BILITOT <0.2*  --     Medical History: Past Medical History  Diagnosis Date  . Varicose veins   . Sickle cell trait   . History of blood clots     Blood clots during menstrual cycle  . Muscle spasms of lower extremity   . Panic attack   . Heart murmur   . Anemia 12/11/2011  . Iron deficiency anemia   . History of blood transfusion 01/13/2014    "related to menses"  . Rheumatoid arthritis(714.0)   . Arthritis     "all over" (01/14/2014)    Medications:  Scheduled:  . heparin  5,000 Units Subcutaneous 3 times per day  . metroNIDAZOLE  500 mg Oral Q12H  . naproxen  375 mg Oral BID WC    Assessment: 43 yo F with hx Fe deficiency anemia, previously requiring IV Fe replacement and blood transfusions.  Pt has been lost to follow-up for the the last year and has not maintained her Fe use.  Fe labs pending, anticipate they are low.  Hgb on presentation was 6.7, now 7.7 s/p transfusion.Asked by MD to give 1 dose of IV Fe prior to discharge today.    Goal of Therapy:  Hgb >10 6/dL Fe >40 mcg/dL  Plan:  Iron Dextran 25 mg IV test dose.  If no reaction within 1 hour of  observation, proceed with 1gm IV infusion over 4-6 hours.  [employer], Pharm.D., BCPS Clinical Pharmacist Pager [phone] 01/14/2014 10:51 AM

## 2014-01-14 NOTE — H&P (Addendum)
[facility] History and Physical  Kimberly Grimes BOF:[id number] DOB: [birth year] DOA: 01/13/2014  Referring physician: ER physician. PCP: Elizabeth Palau, MD   Chief Complaint: Weakness and joint pain.  HPI: Kimberly Grimes is a 43 y.o. female with history of right deficiency anemia with menorrhagia, arthritis presents to the ER because of weakness and increasing joint pain. Patient has moved back to [place] last month from [place]. Patient has been having increasing pain and swelling of the right lower extremity specifically her right ankle. Patient states that she was diagnosed with rheumatoid arthritis 5 years ago and has not been on any definite medications. In addition patient noted that she been getting easily weak and tired last few days. In the ER patient's hemoglobin is found to be around 6.7. Patient has known history of iron deficiency anemia and menorrhagia. Her last menstrual cycle was 5 days ago. Patient otherwise denies any chest pain or shortness of breath nausea vomiting abdominal pain diarrhea black stools. Patient has been admitted for transfusion for her symptomatic anemia. Patient also on exam has right ankle swelling.   Review of Systems: As presented in the history of presenting illness, rest negative.  Past Medical History  Diagnosis Date  . Anemia 12/11/2011  . Varicose veins   . Sickle cell trait   . Rheumatoid arthritis(714.0)   . History of blood clots     Blood clots during menstrual cycle  . Low iron   . Muscle spasms of lower extremity   . Panic attack    Past Surgical History  Procedure Laterality Date  . Tubal ligation  2007   Social History:  reports that she has never smoked. She does not have any smokeless tobacco history on file. She reports that she drinks alcohol. She reports that she does not use illicit drugs. Where does patient live home. Can patient participate in ADLs? Yes.  Allergies  Allergen Reactions  . Decadron  [Dexamethasone]     Vaginal itching and burning reaction.  . Tylenol [Acetaminophen] Other (See Comments)    Unknown reaction    Family History:  Family History  Problem Relation Age of Onset  . Rheum arthritis Mother       Prior to Admission medications   Medication Sig Start Date End Date Taking? Authorizing Provider  albuterol (PROVENTIL HFA;VENTOLIN HFA) 108 (90 BASE) MCG/ACT inhaler Inhale 2 puffs into the lungs every 6 (six) hours as needed. For shortness of breath   Yes Historical Provider, MD  cyclobenzaprine (FLEXERIL) 10 MG tablet Take 10 mg by mouth 3 (three) times daily as needed. For muscle spasms   Yes Historical Provider, MD  ibuprofen (ADVIL,MOTRIN) 200 MG tablet Take 200 mg by mouth every 6 (six) hours as needed for moderate pain.   Yes Historical Provider, MD    Physical Exam: Filed Vitals:   01/13/14 1816 01/13/14 2130 01/13/14 2229  BP: 155/75 125/76 159/92  Pulse: 84 67 65  Temp: 98.6 F (37 C)  98.1 F (36.7 C)  TempSrc: Oral  Oral  Resp: 18 12 18   Weight:   77.7 kg (171 lb 4.8 oz)  SpO2: 100% 100% 100%     General:  Well-developed and nourished.  Eyes: Anicteric mild pallor.  ENT: No discharge from the ears eyes nose mouth.  Neck: No mass felt.  Cardiovascular: S1-S2. Heard. Mild systolic murmur which patient states is chronic.  Respiratory: No rhonchi or crepitations.  Abdomen: Soft nontender bowel sounds present. No guarding or rigidity.  Skin: No rash.  Musculoskeletal: Mild swelling of the right ankle.  Psychiatric: Appears normal.  Neurologic: Alert awake oriented to time place and person. Moves all extremities.  Labs on Admission:  Basic Metabolic Panel:  Recent Labs Lab 01/13/14 1955  NA 139  K 3.7  CL 102  CO2 22  GLUCOSE 103*  BUN 17  CREATININE 0.77  CALCIUM 8.7   Liver Function Tests:  Recent Labs Lab 01/13/14 1955  AST 20  ALT 8  ALKPHOS 92  BILITOT <0.2*  PROT 8.2  ALBUMIN 3.4*   No results found  for this basename: LIPASE, AMYLASE,  in the last 168 hours No results found for this basename: AMMONIA,  in the last 168 hours CBC:  Recent Labs Lab 01/13/14 1955  WBC 5.5  NEUTROABS 3.6  HGB 6.7*  HCT 22.1*  MCV 53.1*  PLT 349   Cardiac Enzymes: No results found for this basename: CKTOTAL, CKMB, CKMBINDEX, TROPONINI,  in the last 168 hours  BNP (last 3 results) No results found for this basename: PROBNP,  in the last 8760 hours CBG:  Recent Labs Lab 01/13/14 1901  GLUCAP 105*    Radiological Exams on Admission: Dg Chest 2 View  01/13/2014   CLINICAL DATA:  Three weeks of body aches and weakness.  EXAM: CHEST  2 VIEW  COMPARISON:  PA and lateral chest of Nov 23, 2011.  FINDINGS: The lungs are well-expanded and clear. The heart and mediastinal structures are within the limits of normal. There is moderate gaseous distention of bowel loops under the left hemidiaphragm. The bony thorax is unremarkable.  IMPRESSION: There is no acute cardiopulmonary disease. There is gaseous distention of bowel loops in the upper abdomen.   Electronically Signed   By: David  Martinique   On: 01/13/2014 21:00     Assessment/Plan Active Problems:   Anemia   Arthralgia   1. Symptomatic severe microcytic hypochromic anemia - anemia panel is pending patient does have history of iron deficiency previously. At this time one unit of packed red blood cell transfusion has been ordered. Follow CBC after transfusion and follow anemia panel patient may need iron replacement. Patient will need to follow with gynecologist as outpatient through her primary care for definite management of her menorrhagia as patient's anemia is suspected to be due to menorrhagia. Presently not actively bleeding. 2. Arthralgia - patient states she has history of rheumatoid arthritis and her right ankle is mildly swollen. I have placed patient on any medication and check uric acid levels and sedimentation rate. Patient will need further  workup through her primary care physician and rheumatologist. Since patient has had recent travel I have ordered Dopplers of the lower extremity to check for DVT. 3. Trichomoniasis - patient has been placed on Flagyl 500 mg by mouth twice a day for 7 days. Patient advised patient's partner will need treatment. 4. Heart murmur - further workup as outpatient. 5. Elevated blood pressure - closely follow blood pressure trends.    Code Status: Full code.  Family Communication: None.  Disposition Plan: Admit for observation.Marland Kitchen    Courtland Reas N. [facility] Pager [phone].  If 7PM-7AM, please contact night-coverage [email or web address] Password [email or web address] 01/14/2014, 12:01 AM

## 2014-01-14 NOTE — ED Provider Notes (Signed)
Medical screening examination/treatment/procedure(s) were performed by non-physician practitioner and as supervising physician I was immediately available for consultation/collaboration.   EKG Interpretation None        Orpah Greek, MD 01/14/14 [phone]

## 2014-01-14 NOTE — Progress Notes (Signed)
Pt did not receive cane she requested prior to discharge. On call care management paged with not return call. Pt stated that she felt safe enough to go home without it tonight. Pt given unit phone number and told to call in the morning.

## 2014-01-15 LAB — TYPE AND SCREEN
ABO/RH(D): AB NEG
ANTIBODY SCREEN: NEGATIVE
UNIT DIVISION: 0
Unit division: 0
Unit division: 0

## 2014-01-15 LAB — URINE CULTURE: Colony Count: >=100000

## 2014-01-30 ENCOUNTER — Encounter (HOSPITAL_COMMUNITY): Payer: Self-pay | Admitting: Emergency Medicine

## 2014-01-30 ENCOUNTER — Emergency Department (HOSPITAL_COMMUNITY)
Admission: EM | Admit: 2014-01-30 | Discharge: 2014-01-30 | Disposition: A | Discharge status: Home / Self Care | Payer: Medicaid Other | Attending: Emergency Medicine | Admitting: Emergency Medicine

## 2014-01-30 ENCOUNTER — Emergency Department (HOSPITAL_COMMUNITY): Payer: Medicaid Other

## 2014-01-30 DIAGNOSIS — R011 Cardiac murmur, unspecified: Secondary | ICD-10-CM | POA: Diagnosis not present

## 2014-01-30 DIAGNOSIS — Z792 Long term (current) use of antibiotics: Secondary | ICD-10-CM | POA: Insufficient documentation

## 2014-01-30 DIAGNOSIS — M79671 Pain in right foot: Secondary | ICD-10-CM

## 2014-01-30 DIAGNOSIS — D649 Anemia, unspecified: Secondary | ICD-10-CM | POA: Insufficient documentation

## 2014-01-30 DIAGNOSIS — M7989 Other specified soft tissue disorders: Secondary | ICD-10-CM | POA: Diagnosis not present

## 2014-01-30 DIAGNOSIS — Z8679 Personal history of other diseases of the circulatory system: Secondary | ICD-10-CM | POA: Diagnosis not present

## 2014-01-30 DIAGNOSIS — M25473 Effusion, unspecified ankle: Secondary | ICD-10-CM | POA: Diagnosis not present

## 2014-01-30 DIAGNOSIS — Z8659 Personal history of other mental and behavioral disorders: Secondary | ICD-10-CM | POA: Diagnosis not present

## 2014-01-30 DIAGNOSIS — Z8739 Personal history of other diseases of the musculoskeletal system and connective tissue: Secondary | ICD-10-CM | POA: Diagnosis not present

## 2014-01-30 DIAGNOSIS — M79609 Pain in unspecified limb: Secondary | ICD-10-CM | POA: Diagnosis not present

## 2014-01-30 DIAGNOSIS — Z79899 Other long term (current) drug therapy: Secondary | ICD-10-CM | POA: Insufficient documentation

## 2014-01-30 DIAGNOSIS — M25476 Effusion, unspecified foot: Secondary | ICD-10-CM | POA: Insufficient documentation

## 2014-01-30 DIAGNOSIS — Z86718 Personal history of other venous thrombosis and embolism: Secondary | ICD-10-CM | POA: Diagnosis not present

## 2014-01-30 MED ORDER — NAPROXEN 500 MG PO TABS: 500.0000 mg | ORAL_TABLET | Freq: Two times a day (BID) | ORAL | Status: DC

## 2014-01-30 MED ORDER — TRAMADOL HCL 50 MG PO TABS: 50.0000 mg | ORAL_TABLET | Freq: Four times a day (QID) | ORAL | Status: DC | PRN

## 2014-01-30 NOTE — ED Provider Notes (Signed)
CSN: [id number]     Arrival date & time 01/30/14  1724 History  This chart was scribed for non-physician practitioner working with Babette Relic, MD by Mercy Moore, ED Scribe. This patient was seen in room TR09C/TR09C and the patient's care was started at 6:54 PM.   Chief Complaint  Patient presents with  . Foot Pain      HPI Comments: Kimberly Grimes is a 43 y.o. female with history of psoriatic arthritis who presents to the Emergency Department complaining of right foot pain and swelling, ongoing for a few weeks. Patient reports treatment for plantar fasciitis for which she was given steroids. Patient reports improvement with treatment but states her pain flared up this evening. Patient reports treatment with ibuprofen, dosage taken prior to arrival. Patient is ambulatory with severe pain. Patient denies causative trauma or injury for recent episode but reports increased driving.      The history is provided by the patient. No language interpreter was used.    Past Medical History  Diagnosis Date  . Varicose veins   . Sickle cell trait   . History of blood clots     Blood clots during menstrual cycle  . Muscle spasms of lower extremity   . Panic attack   . Heart murmur   . Anemia 12/11/2011  . Iron deficiency anemia   . History of blood transfusion 01/13/2014    "related to menses"  . Rheumatoid arthritis(714.0)   . Arthritis     "all over" (01/14/2014)   Past Surgical History  Procedure Laterality Date  . Tubal ligation  2007   Family History  Problem Relation Age of Onset  . Rheum arthritis Mother    History  Substance Use Topics  . Smoking status: Never Smoker   . Smokeless tobacco: Never Used  . Alcohol Use: Yes     Comment: 01/14/2014 "might have a drink or 2 couple times/yr, if that"   OB History   Grav Para Term Preterm Abortions TAB SAB Ect Mult Living   4        1 5      Review of Systems  Constitutional: Negative for fever and chills.  Gastrointestinal:  Negative for nausea, vomiting and diarrhea.  Genitourinary: Negative for dysuria.  Neurological: Negative for weakness and numbness.      Allergies  Decadron and Tylenol  Home Medications   Prior to Admission medications   Medication Sig Start Date End Date Taking? Authorizing Provider  albuterol (PROVENTIL HFA;VENTOLIN HFA) 108 (90 BASE) MCG/ACT inhaler Inhale 2 puffs into the lungs every 6 (six) hours as needed. For shortness of breath    Historical Provider, MD  cyclobenzaprine (FLEXERIL) 10 MG tablet Take 10 mg by mouth 3 (three) times daily as needed. For muscle spasms    Historical Provider, MD  ferrous sulfate 325 (65 FE) MG tablet Take 1 tablet (325 mg total) by mouth 2 (two) times daily with a meal. 01/14/14   Shanker Kristeen Mans, MD  ibuprofen (ADVIL,MOTRIN) 200 MG tablet Take 200 mg by mouth every 6 (six) hours as needed for moderate pain.    Historical Provider, MD  metroNIDAZOLE (FLAGYL) 500 MG tablet Take 1 tablet (500 mg total) by mouth every 12 (twelve) hours. 01/14/14   Shanker Kristeen Mans, MD  naproxen (NAPROSYN) 375 MG tablet Take 1 tablet (375 mg total) by mouth 2 (two) times daily with a meal. 01/14/14   Jonetta Osgood, MD   Triage Vitals: BP 131/74  Pulse  85  Temp(Src) 98.1 F (36.7 C)  Resp 18  Ht 5\' 4"  (1.626 m)  Wt 167 lb (75.751 kg)  BMI 28.65 kg/m2  SpO2 95%  LMP 01/03/2014 Physical Exam  Nursing note and vitals reviewed. Constitutional: She is oriented to person, place, and time. She appears well-developed and well-nourished.  Non-toxic appearance. She does not have a sickly appearance. She does not appear ill. No distress.  HENT:  Head: Normocephalic and atraumatic.  Eyes: EOM are normal.  Neck: Normal range of motion. Neck supple.  Cardiovascular: Normal rate.   Pulmonary/Chest: Effort normal. No respiratory distress.  Musculoskeletal: Normal range of motion.       Right ankle: She exhibits swelling. She exhibits normal range of motion.        Feet:  Tenderness to palpation over the proximal transverse arch of the right foot. Minimal associated swelling. Full active range of motion to the foot and toes. No obvious deformity. No overlying erythema. No obvious lesions.  Neurological: She is alert and oriented to person, place, and time.  Skin: Skin is warm and dry. She is not diaphoretic.  Psychiatric: She has a normal mood and affect. Her behavior is normal.    ED Course  Procedures (including critical care time) COORDINATION OF CARE: 6:58 PM- Discussed treatment plan with patient at bedside and patient agreed to plan.    Labs Review Labs Reviewed - No data to display  Imaging Review Dg Foot Complete Right  01/30/2014   CLINICAL DATA:  Superior pain near ankles.  No trauma.  EXAM: RIGHT FOOT COMPLETE - 3+ VIEW  COMPARISON:  03/11/2012  FINDINGS: Tiny Achilles spur. No acute fracture or dislocation. No radiopaque foreign object. No periosteal reaction or callus deposition. Joint spaces maintained.  IMPRESSION: No acute osseous abnormality.   Electronically Signed   By: Abigail Miyamoto M.D.   On: 01/30/2014 19:39    MDM   Final diagnoses:  Right foot pain   Patient presents with persistent right foot pain, was evaluated by PCP on 7/2 for similar complaints and was referred to orthopedist. Unable to access orthopedic preference and view imaging results.  Negative acute findings on exam, will have patient follow up with orthopedist, rheumatologist, or podiatrist. Discussed imaging results, and treatment plan with the patient. Return precautions given. Reports understanding and no other concerns at this time.  Patient is stable for discharge at this time.  Meds given in ED:  Medications - No data to display  Discharge Medication List as of 01/30/2014  8:09 PM    START taking these medications   Details  naproxen (NAPROSYN) 500 MG tablet Take 1 tablet (500 mg total) by mouth 2 (two) times daily with a meal., Starting 01/30/2014,  Until Discontinued, Print    traMADol (ULTRAM) 50 MG tablet Take 1 tablet (50 mg total) by mouth every 6 (six) hours as needed., Starting 01/30/2014, Until Discontinued, Print       I personally performed the services described in this documentation, which was scribed in my presence. The recorded information has been reviewed and is accurate.    Lorrine Kin, PA-C 01/31/14 [phone]

## 2014-01-30 NOTE — ED Notes (Signed)
She states "my right foots been hurting for a while. The orthopedic doctor gave me steroids for the pain but i took them all and the pain is still severe."

## 2014-01-30 NOTE — Discharge Instructions (Signed)
Call a podiatrist, rheumatologist, orthopedist for further evaluation of your foot pain. Call for a follow up appointment with a Family or Primary Care Provider.  Return if Symptoms worsen.   Take medication as prescribed.  Elevate your foot when you're not walking or standing. Ice your foot 3-4 times a day. Do not take ibuprofen while you're taking naproxen. Wear shoes with good heel and arch support.

## 2014-01-31 NOTE — ED Provider Notes (Signed)
Medical screening examination/treatment/procedure(s) were performed by non-physician practitioner and as supervising physician I was immediately available for consultation/collaboration.   EKG Interpretation None       Babette Relic, MD 01/31/14 1335

## 2014-02-02 ENCOUNTER — Ambulatory Visit (HOSPITAL_BASED_OUTPATIENT_CLINIC_OR_DEPARTMENT_OTHER): Payer: Medicaid Other | Admitting: Oncology

## 2014-02-02 ENCOUNTER — Ambulatory Visit (HOSPITAL_BASED_OUTPATIENT_CLINIC_OR_DEPARTMENT_OTHER): Payer: Medicaid Other

## 2014-02-02 ENCOUNTER — Telehealth: Payer: Self-pay | Admitting: Oncology

## 2014-02-02 ENCOUNTER — Encounter: Payer: Self-pay | Admitting: Oncology

## 2014-02-02 VITALS — BP 136/67 | HR 81 | Temp 99.1°F | Resp 19 | Ht 64.0 in | Wt 171.4 lb

## 2014-02-02 DIAGNOSIS — D500 Iron deficiency anemia secondary to blood loss (chronic): Secondary | ICD-10-CM

## 2014-02-02 DIAGNOSIS — N920 Excessive and frequent menstruation with regular cycle: Secondary | ICD-10-CM

## 2014-02-02 LAB — CBC WITH DIFFERENTIAL/PLATELET
BASO%: 0.7 % (ref 0.0–2.0)
BASOS ABS: 0.1 10*3/uL (ref 0.0–0.1)
EOS%: 1.9 % (ref 0.0–7.0)
Eosinophils Absolute: 0.1 10*3/uL (ref 0.0–0.5)
HCT: 30.8 % — ABNORMAL LOW (ref 34.8–46.6)
HEMOGLOBIN: 9.6 g/dL — AB (ref 11.6–15.9)
LYMPH%: 15.9 % (ref 14.0–49.7)
MCH: 20.4 pg — AB (ref 25.1–34.0)
MCHC: 31.2 g/dL — ABNORMAL LOW (ref 31.5–36.0)
MCV: 65.4 fL — AB (ref 79.5–101.0)
MONO#: 0.5 10*3/uL (ref 0.1–0.9)
MONO%: 7.5 % (ref 0.0–14.0)
NEUT#: 5.4 10*3/uL (ref 1.5–6.5)
NEUT%: 74 % (ref 38.4–76.8)
Platelets: 334 10*3/uL (ref 145–400)
RBC: 4.71 10*6/uL (ref 3.70–5.45)
RDW: 37.3 % — ABNORMAL HIGH (ref 11.2–14.5)
WBC: 7.2 10*3/uL (ref 3.9–10.3)
lymph#: 1.2 10*3/uL (ref 0.9–3.3)

## 2014-02-02 LAB — IRON AND TIBC CHCC
%SAT: 15 % — ABNORMAL LOW (ref 21–57)
IRON: 37 ug/dL — AB (ref 41–142)
TIBC: 240 ug/dL (ref 236–444)
UIBC: 203 ug/dL (ref 120–384)

## 2014-02-02 LAB — FERRITIN CHCC: Ferritin: 92 ng/ml (ref 9–269)

## 2014-02-02 NOTE — Telephone Encounter (Signed)
gv adn printed appt sched and avs for pt for July and OCT...sed added tx.

## 2014-02-02 NOTE — Progress Notes (Signed)
Hematology and Oncology Follow Up Visit  Kimberly Grimes [id number] January 10, 1971 43 y.o. 02/02/2014 1:38 PM BLOUNT,ALVIN VINCENT, [name], Denton Meek, *   Principle Diagnosis: 43 year old women with iron deficiency anemia due to heavy menstrual bleeding diagnosed in 2013.  Prior Therapy: Feraheme 1,020 mg IV on 12/12/11 and repeated on 02/22/12.  Current therapy: Watchful observation  Interim History: [name] presents for follow-up today. She was last seen in our office back in 05/23/2013 She received her last Feraheme infusion in August 2013 but subsequently presented with anemia in July of 2015. She was hospitalized briefly for hemoglobin 6.7. She received one unit of packed red cell transfusion as well as IV iron. She tolerated it well No infusion-related reaction. She remains fatigued however. Appetite has been increased since the iron infusion. Continues to have heavy periods that last about 1 week. Denies chest pain, shortness of breath, and dyspnea. No abdominal pain, nausea, or vomiting. Denies hematuria and melena. She felt slightly better after the transfusion and hospitalization but now is reporting more fatigue. Rest of her review of systems unremarkable.  Medications: I have reviewed the patient's current medications.   Current Outpatient Prescriptions  Medication Sig Dispense Refill  . albuterol (PROVENTIL HFA;VENTOLIN HFA) 108 (90 BASE) MCG/ACT inhaler Inhale 2 puffs into the lungs every 6 (six) hours as needed. For shortness of breath      . cyclobenzaprine (FLEXERIL) 10 MG tablet Take 10 mg by mouth 3 (three) times daily as needed. For muscle spasms      . ferrous sulfate 325 (65 FE) MG tablet Take 1 tablet (325 mg total) by mouth 2 (two) times daily with a meal.  60 tablet  0  . metroNIDAZOLE (FLAGYL) 500 MG tablet Take 1 tablet (500 mg total) by mouth every 12 (twelve) hours.  14 tablet  0  . naproxen (NAPROSYN) 500 MG tablet Take 1 tablet (500 mg total) by mouth 2 (two)  times daily with a meal.  30 tablet  0  . traMADol (ULTRAM) 50 MG tablet Take 1 tablet (50 mg total) by mouth every 6 (six) hours as needed.  10 tablet  0   No current facility-administered medications for this visit.    Allergies:  Allergies  Allergen Reactions  . Decadron [Dexamethasone]     Vaginal itching and burning reaction.  . Tylenol [Acetaminophen] Other (See Comments)    Unknown reaction    Past Medical History, Surgical history, Social history, and Family History were reviewed and updated.  Marland Kitchen  Physical Exam: Blood pressure 136/67, pulse 81, temperature 99.1 F (37.3 C), temperature source Oral, resp. rate 19, height 5\' 4"  (1.626 m), weight 171 lb 6.4 oz (77.747 kg), last menstrual period 01/03/2014, SpO2 100.00%. ECOG:  General appearance: alert, cooperative and no distress appeared pale. Head: Normocephalic, without obvious abnormality, atraumatic Neck: no adenopathy Lymph nodes: Cervical, supraclavicular, and axillary nodes normal. Heart:regular rate and rhythm, S1, S2 normal, no murmur, click, rub or gallop Lung: clear to auscultation Abdomen: soft, non-tender, without masses or organomegaly EXT:no erythema, induration, or nodules   Lab Results: Lab Results  Component Value Date   WBC 6.5 01/14/2014   HGB 7.7* 01/14/2014   HCT 25.1* 01/14/2014   MCV 56.7* 01/14/2014   PLT 307 01/14/2014     Chemistry      Component Value Date/Time   NA 141 01/14/2014 0418   K 3.4* 01/14/2014 0418   CL 105 01/14/2014 0418   CO2 23 01/14/2014 0418   BUN  12 01/14/2014 0418   CREATININE 0.65 01/14/2014 0418      Component Value Date/Time   CALCIUM 8.4 01/14/2014 0418   ALKPHOS 92 01/13/2014 1955   AST 20 01/13/2014 1955   ALT 8 01/13/2014 1955   BILITOT <0.2* 01/13/2014 1955      Impression and Plan: This is a 43 year old female with the following issues:  1. Iron deficiency anemia. The patient has received 2 doses of Feraheme; last one in August 2013. She tolerated the infusion well without  any infusion-related toxicity. She developed anemia with a hemoglobin of 6.7 on 01/13/2014 with iron levels less than 10 and a ferritin of 4. After a packed red cell transfusion and IV iron she had some improvement but she is still symptomatic. I will repeat her counts today including a CBC and iron studies and if she continues to be iron deficient although her another 1 g of IV iron. Risks and benefits of the medication was discussed including infusion-related toxicity anaphylaxis and she is agreeable to proceed.  2. Follow-up in about 3 months to recheck blood counts.     GYKZLD,JTTSV 7/21/20151:38 PM

## 2014-02-04 ENCOUNTER — Ambulatory Visit (HOSPITAL_BASED_OUTPATIENT_CLINIC_OR_DEPARTMENT_OTHER): Payer: Medicaid Other

## 2014-02-04 ENCOUNTER — Other Ambulatory Visit: Payer: Self-pay | Admitting: *Deleted

## 2014-02-04 VITALS — BP 111/71 | HR 65 | Temp 97.4°F | Resp 20

## 2014-02-04 DIAGNOSIS — N920 Excessive and frequent menstruation with regular cycle: Secondary | ICD-10-CM

## 2014-02-04 DIAGNOSIS — D500 Iron deficiency anemia secondary to blood loss (chronic): Secondary | ICD-10-CM

## 2014-02-04 MED ORDER — SODIUM CHLORIDE 0.9 % IV SOLN
1020.0000 mg | Freq: Once | INTRAVENOUS | Status: AC
  Administered 2014-02-04: 1020 mg via INTRAVENOUS
  Filled 2014-02-04: qty 34

## 2014-02-04 MED ORDER — SODIUM CHLORIDE 0.9 % IV SOLN
Freq: Once | INTRAVENOUS | Status: AC
  Administered 2014-02-04: 08:00:00 via INTRAVENOUS

## 2014-02-04 NOTE — Patient Instructions (Signed)

## 2014-02-23 IMAGING — CR DG KNEE COMPLETE 4+V*L*
5 series · 5 of 5 positions shown · non-contrast
Comparison: None.

CLINICAL DATA: Left anterior knee pain

LEFT KNEE - COMPLETE 4+ VIEW

[t knee ap left]
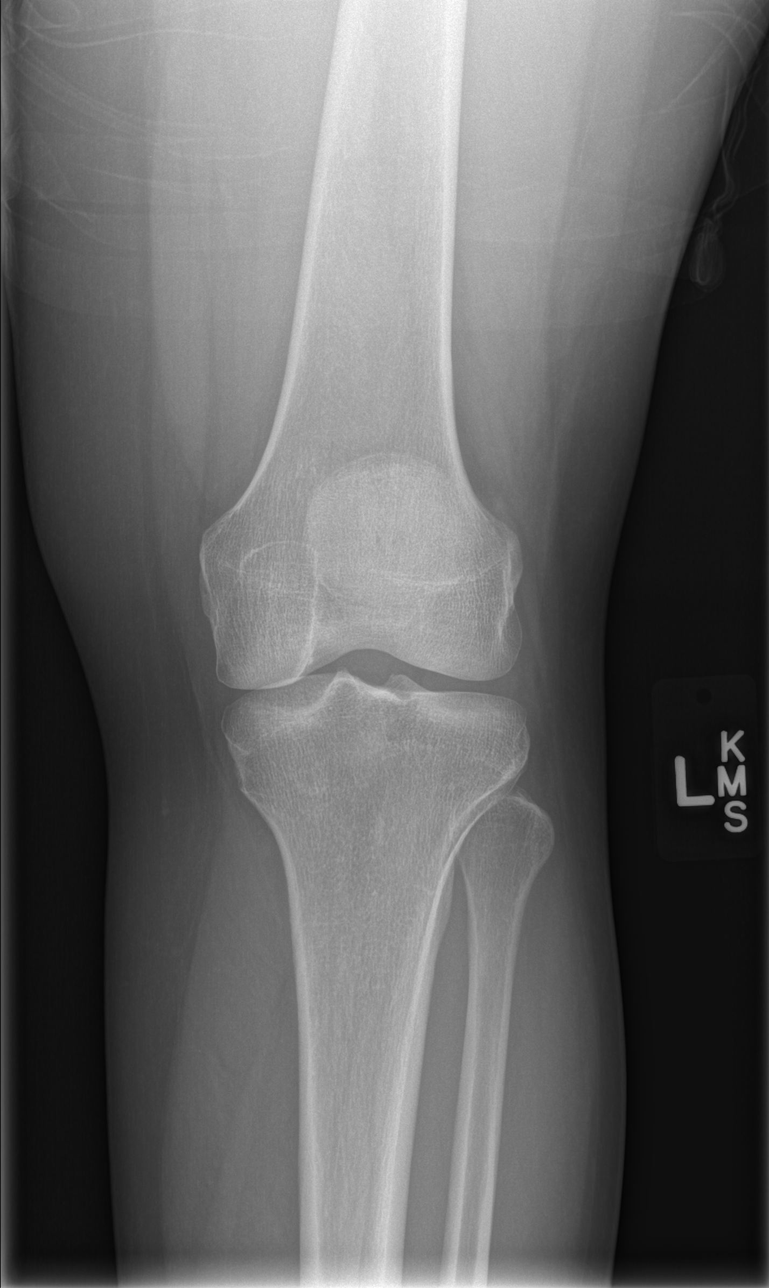

[t knee obl left (1 of 2)]
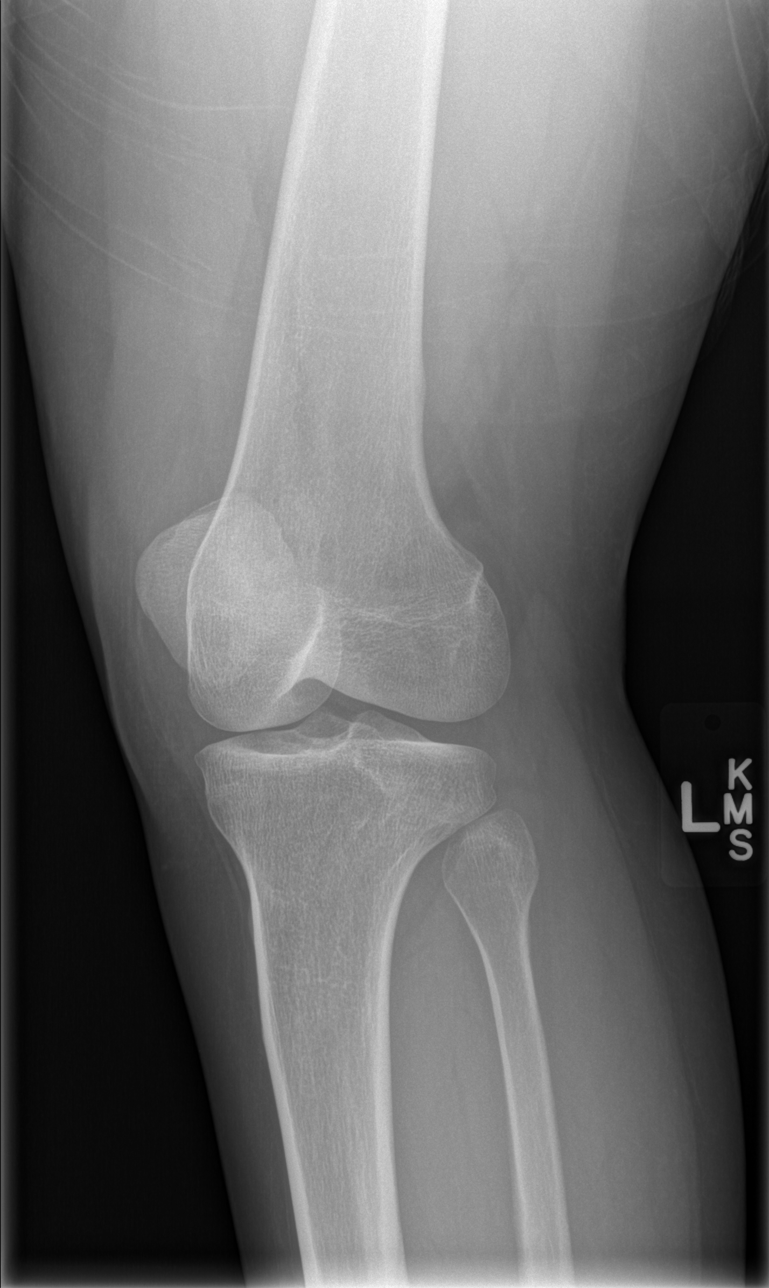

[t knee obl left (2 of 2)]
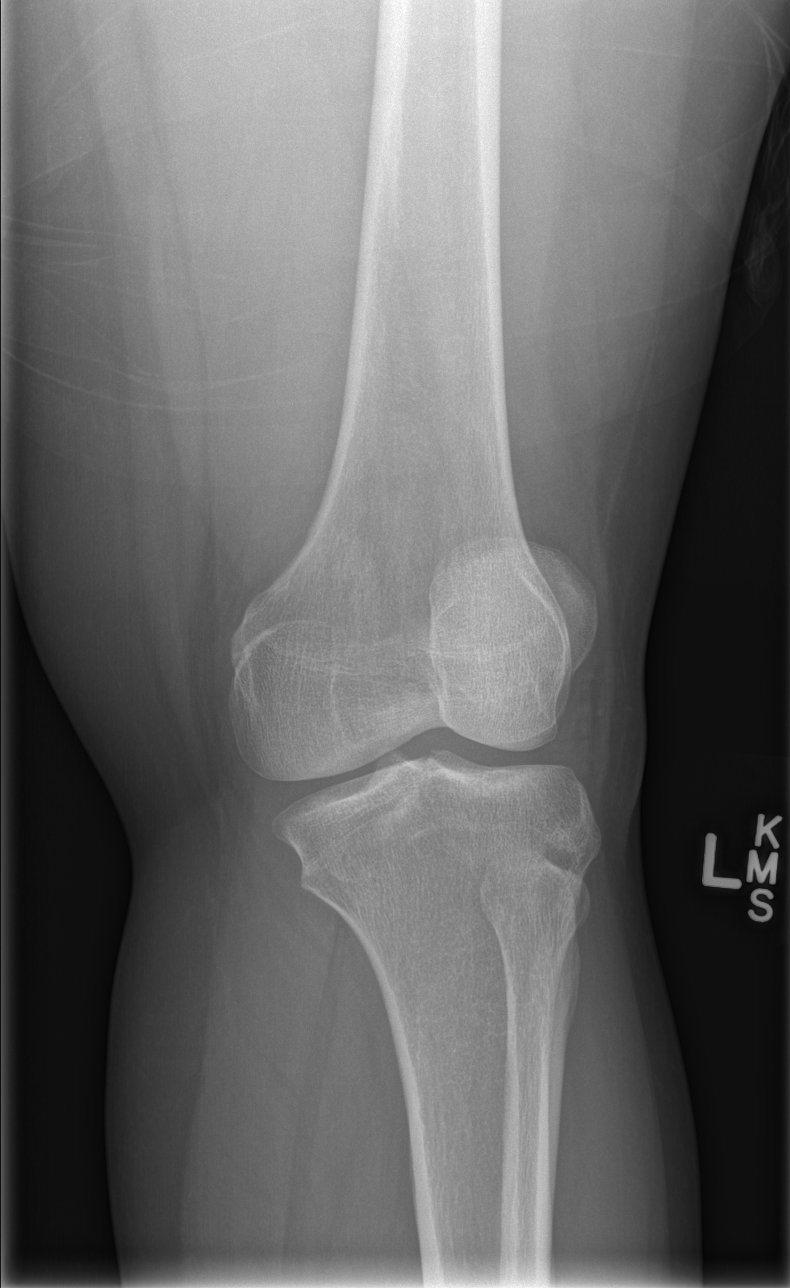

[t knee lat left (1 of 2)]
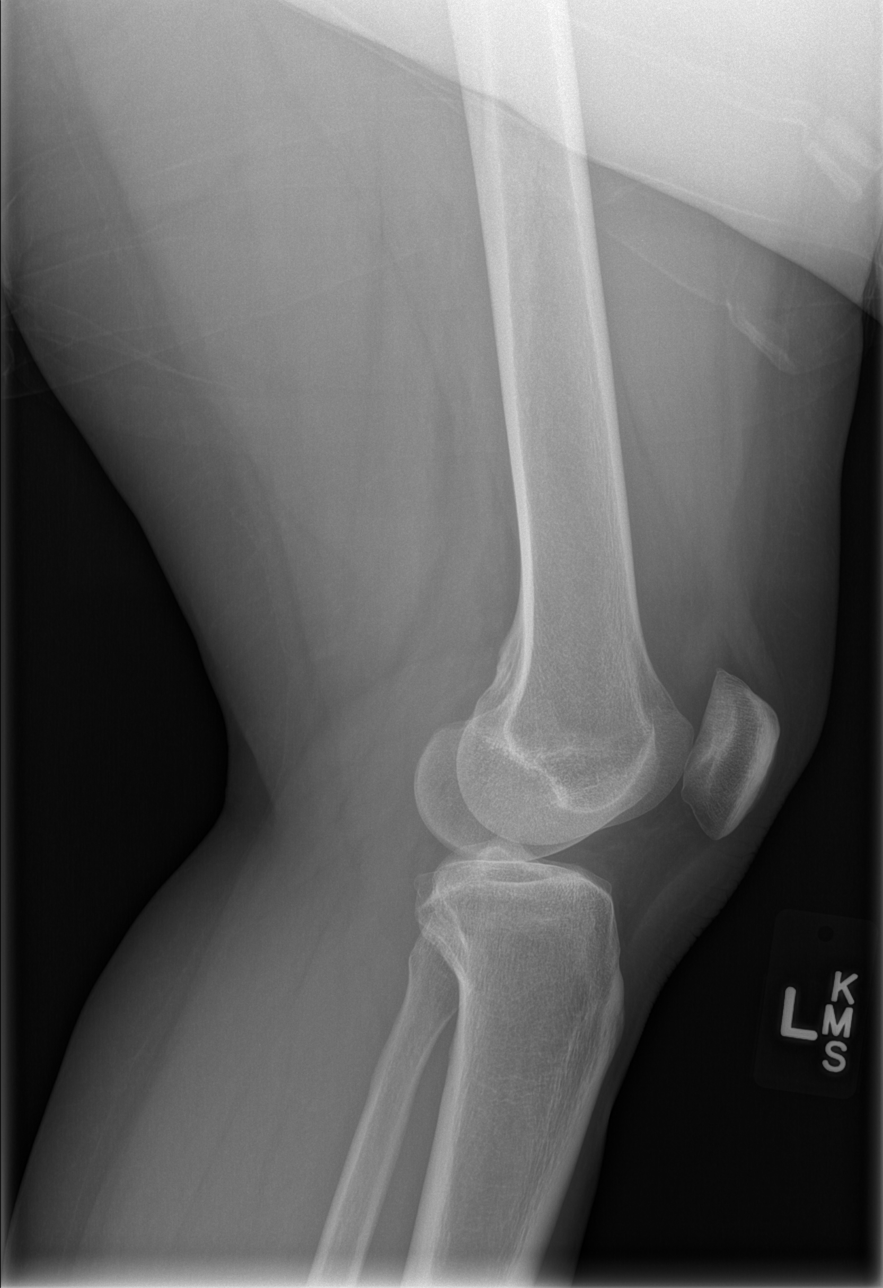

[t knee lat left (2 of 2)]
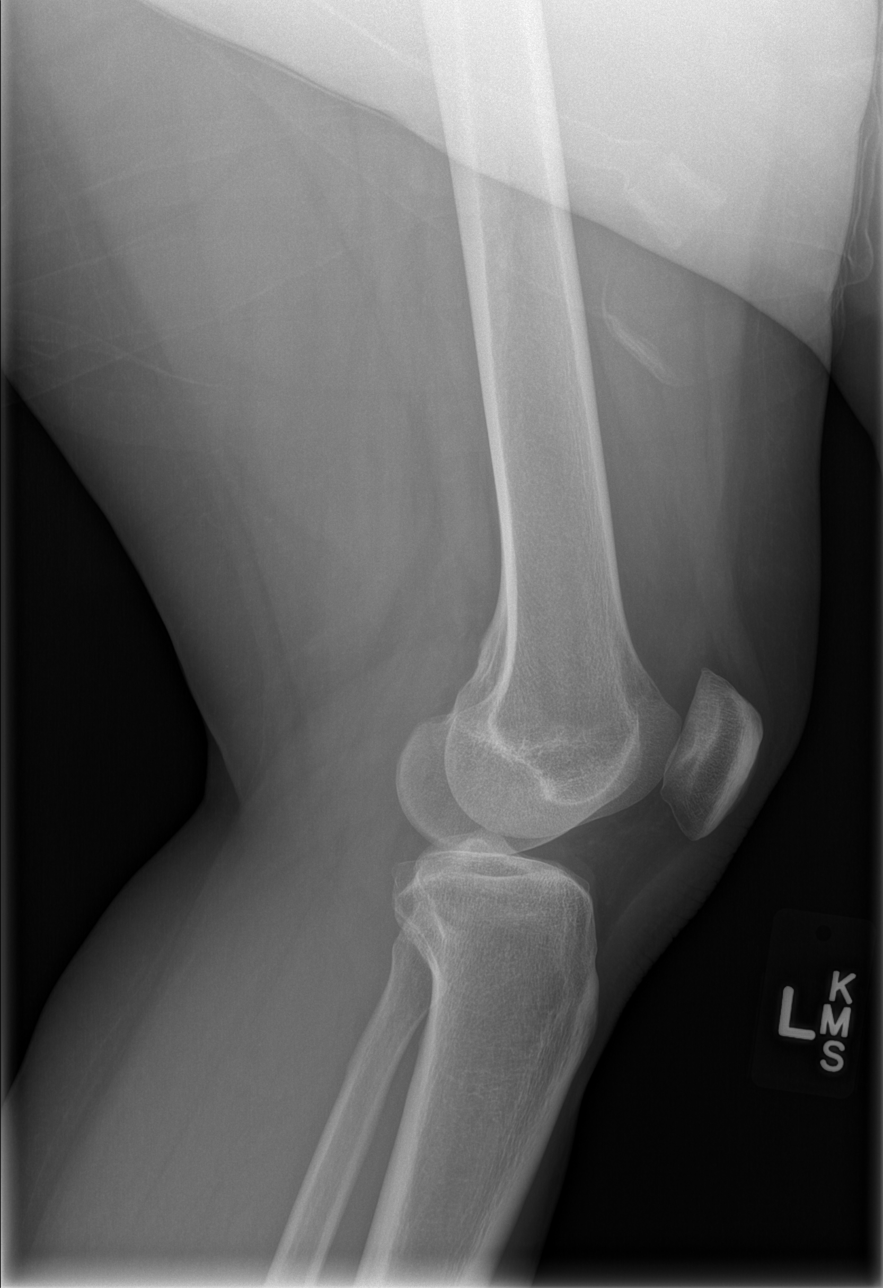

[5 of 5 positions shown; findings below may reference images not displayed]

FINDINGS: No displaced fracture or dislocation.  No aggressive
osseous lesions.  No overt degenerative changes. Tiny joint
effusion.
IMPRESSION: No acute osseous abnormality of the left knee.

Tiny joint effusion.

## 2014-03-24 ENCOUNTER — Encounter (HOSPITAL_COMMUNITY): Payer: Self-pay | Admitting: Emergency Medicine

## 2014-03-24 ENCOUNTER — Emergency Department (HOSPITAL_COMMUNITY)
Admission: EM | Admit: 2014-03-24 | Discharge: 2014-03-24 | Discharge status: Against Medical Advice | Payer: Medicaid Other | Attending: Emergency Medicine | Admitting: Emergency Medicine

## 2014-03-24 DIAGNOSIS — R52 Pain, unspecified: Secondary | ICD-10-CM | POA: Diagnosis present

## 2014-03-24 DIAGNOSIS — M069 Rheumatoid arthritis, unspecified: Secondary | ICD-10-CM | POA: Diagnosis not present

## 2014-03-24 DIAGNOSIS — IMO0001 Reserved for inherently not codable concepts without codable children: Secondary | ICD-10-CM | POA: Insufficient documentation

## 2014-03-24 DIAGNOSIS — R011 Cardiac murmur, unspecified: Secondary | ICD-10-CM | POA: Diagnosis not present

## 2014-03-24 DIAGNOSIS — Z8659 Personal history of other mental and behavioral disorders: Secondary | ICD-10-CM | POA: Diagnosis not present

## 2014-03-24 DIAGNOSIS — M2550 Pain in unspecified joint: Secondary | ICD-10-CM

## 2014-03-24 DIAGNOSIS — D573 Sickle-cell trait: Secondary | ICD-10-CM | POA: Insufficient documentation

## 2014-03-24 DIAGNOSIS — Z79899 Other long term (current) drug therapy: Secondary | ICD-10-CM | POA: Insufficient documentation

## 2014-03-24 DIAGNOSIS — Z86718 Personal history of other venous thrombosis and embolism: Secondary | ICD-10-CM | POA: Diagnosis not present

## 2014-03-24 DIAGNOSIS — D509 Iron deficiency anemia, unspecified: Secondary | ICD-10-CM | POA: Diagnosis not present

## 2014-03-24 DIAGNOSIS — M791 Myalgia, unspecified site: Secondary | ICD-10-CM

## 2014-03-24 LAB — CBC WITH DIFFERENTIAL/PLATELET
Basophils Absolute: 0 10*3/uL (ref 0.0–0.1)
Basophils Relative: 0 % (ref 0–1)
EOS PCT: 3 % (ref 0–5)
Eosinophils Absolute: 0.1 10*3/uL (ref 0.0–0.7)
HEMATOCRIT: 29.7 % — AB (ref 36.0–46.0)
HEMOGLOBIN: 9.9 g/dL — AB (ref 12.0–15.0)
Lymphocytes Relative: 21 % (ref 12–46)
Lymphs Abs: 1 10*3/uL (ref 0.7–4.0)
MCH: 24.9 pg — ABNORMAL LOW (ref 26.0–34.0)
MCHC: 33.3 g/dL (ref 30.0–36.0)
MCV: 74.8 fL — ABNORMAL LOW (ref 78.0–100.0)
MONO ABS: 0.3 10*3/uL (ref 0.1–1.0)
MONOS PCT: 6 % (ref 3–12)
Neutro Abs: 3.3 10*3/uL (ref 1.7–7.7)
Neutrophils Relative %: 70 % (ref 43–77)
Platelets: 299 10*3/uL (ref 150–400)
RBC: 3.97 MIL/uL (ref 3.87–5.11)
RDW: 20.9 % — AB (ref 11.5–15.5)
WBC: 4.8 10*3/uL (ref 4.0–10.5)

## 2014-03-24 LAB — BASIC METABOLIC PANEL
Anion gap: 11 (ref 5–15)
BUN: 9 mg/dL (ref 6–23)
CALCIUM: 8.6 mg/dL (ref 8.4–10.5)
CHLORIDE: 104 meq/L (ref 96–112)
CO2: 25 mEq/L (ref 19–32)
CREATININE: 0.6 mg/dL (ref 0.50–1.10)
GFR calc Af Amer: >90 mL/min (ref >90)
GFR calc non Af Amer: >90 mL/min (ref >90)
GLUCOSE: 96 mg/dL (ref 70–99)
Potassium: 3.9 mEq/L (ref 3.7–5.3)
Sodium: 140 mEq/L (ref 137–147)

## 2014-03-24 NOTE — ED Notes (Signed)
Went into room to discharge pt. AMA, pt. Was not in the room.  Pt. Stated, "I have to leave to pick up my kids at 14:00. "

## 2014-03-24 NOTE — Discharge Instructions (Signed)
Followup with [facility] as scheduled. As we discussed recommend connective tissue and arthritis workup. Continue current medications.

## 2014-03-24 NOTE — ED Notes (Signed)
Pt c/o generalized body aches x 1 month worse at night with stiffness since being admitted

## 2014-03-24 NOTE — ED Provider Notes (Signed)
CSN: [id number]     Arrival date & time 03/24/14  0919 History   First MD Initiated Contact with Patient 03/24/14 [phone]     Chief Complaint  Patient presents with  . Generalized Body Aches     (Consider location/radiation/quality/duration/timing/severity/associated sxs/prior Treatment) The history is provided by the patient.   a 43 year old the female, with a 6 month history of myalgias and arthralgias. Worse in the last 2 months. [facility] and has arrangements being made for rheumatology followup. Patient drove herself here does not have anybody to drive her home. Patient also had significant anemia and required blood transfusion recently this is felt probably to be due to a fibroid uterus and excessive vaginal bleeding. Patient has been feeling fatigue and was also concern whether the hemoglobin was low again. Patient's been treated with pain medicine and anti-inflammatory medicines for the joint aches and bodyaches without a whole lot of improvement. Patient states that the pain is 8/10.  Past Medical History  Diagnosis Date  . Varicose veins   . Sickle cell trait   . History of blood clots     Blood clots during menstrual cycle  . Muscle spasms of lower extremity   . Panic attack   . Heart murmur   . Anemia 12/11/2011  . Iron deficiency anemia   . History of blood transfusion 01/13/2014    "related to menses"  . Rheumatoid arthritis(714.0)   . Arthritis     "all over" (01/14/2014)   Past Surgical History  Procedure Laterality Date  . Tubal ligation  2007   Family History  Problem Relation Age of Onset  . Rheum arthritis Mother    History  Substance Use Topics  . Smoking status: Never Smoker   . Smokeless tobacco: Never Used  . Alcohol Use: Yes     Comment: 01/14/2014 "might have a drink or 2 couple times/yr, if that"   OB History   Grav Para Term Preterm Abortions TAB SAB Ect Mult Living   4        1 5      Review of Systems   Constitutional: Positive for fatigue.  HENT: Negative for congestion.   Eyes: Negative for redness.  Respiratory: Negative for shortness of breath.   Cardiovascular: Negative for chest pain.  Gastrointestinal: Negative for abdominal pain.  Genitourinary: Negative for dysuria.  Musculoskeletal: Positive for arthralgias and myalgias.  Skin: Negative for rash.  Neurological: Negative for headaches.  Hematological: Does not bruise/bleed easily.  Psychiatric/Behavioral: Negative for confusion.      Allergies  Decadron and Tylenol  Home Medications   Prior to Admission medications   Medication Sig Start Date End Date Taking? Authorizing Provider  albuterol (PROVENTIL HFA;VENTOLIN HFA) 108 (90 BASE) MCG/ACT inhaler Inhale 2 puffs into the lungs every 6 (six) hours as needed. For shortness of breath   Yes Historical Provider, MD  ferrous sulfate 325 (65 FE) MG tablet Take 325 mg by mouth daily with breakfast.   Yes Historical Provider, MD  HYDROCODONE-ACETAMINOPHEN PO Take 1 tablet by mouth every 6 (six) hours as needed (for pain).   Yes Historical Provider, MD  ibuprofen (ADVIL,MOTRIN) 200 MG tablet Take 200 mg by mouth every 6 (six) hours as needed for moderate pain.   Yes Historical Provider, MD  naproxen (NAPROSYN) 500 MG tablet Take 500 mg by mouth 2 (two) times daily as needed for moderate pain.   Yes Historical Provider, MD   BP 141/81  Pulse 72  Temp(Src) 98.3 F (36.8 C) (Oral)  Resp 16  SpO2 100% Physical Exam  Nursing note and vitals reviewed. Constitutional: She is oriented to person, place, and time. She appears well-developed and well-nourished. No distress.  HENT:  Head: Normocephalic and atraumatic.  Mouth/Throat: Oropharynx is clear and moist.  Eyes: Conjunctivae and EOM are normal. Pupils are equal, round, and reactive to light.  Neck: Normal range of motion.  Cardiovascular: Normal rate, regular rhythm, normal heart sounds and intact distal pulses.   No murmur  heard. Pulmonary/Chest: Effort normal and breath sounds normal.  Abdominal: Soft. Bowel sounds are normal.  Musculoskeletal: Normal range of motion. She exhibits no edema.  Neurological: She is alert and oriented to person, place, and time. No cranial nerve deficit. She exhibits normal muscle tone. Coordination normal.  Skin: Skin is warm.    ED Course  Procedures (including critical care time) Labs Review Labs Reviewed  CBC WITH DIFFERENTIAL - Abnormal; Notable for the following:    Hemoglobin 9.9 (*)    HCT 29.7 (*)    MCV 74.8 (*)    MCH 24.9 (*)    RDW 20.9 (*)    All other components within normal limits  BASIC METABOLIC PANEL   Results for orders placed during the hospital encounter of 03/24/14  CBC WITH DIFFERENTIAL      Result Value Ref Range   WBC 4.8  4.0 - 10.5 K/uL   RBC 3.97  3.87 - 5.11 MIL/uL   Hemoglobin 9.9 (*) 12.0 - 15.0 g/dL   HCT 29.7 (*) 36.0 - 46.0 %   MCV 74.8 (*) 78.0 - 100.0 fL   MCH 24.9 (*) 26.0 - 34.0 pg   MCHC 33.3  30.0 - 36.0 g/dL   RDW 20.9 (*) 11.5 - 15.5 %   Platelets 299  150 - 400 K/uL   Neutrophils Relative % 70  43 - 77 %   Neutro Abs 3.3  1.7 - 7.7 K/uL   Lymphocytes Relative 21  12 - 46 %   Lymphs Abs 1.0  0.7 - 4.0 K/uL   Monocytes Relative 6  3 - 12 %   Monocytes Absolute 0.3  0.1 - 1.0 K/uL   Eosinophils Relative 3  0 - 5 %   Eosinophils Absolute 0.1  0.0 - 0.7 K/uL   Basophils Relative 0  0 - 1 %   Basophils Absolute 0.0  0.0 - 0.1 K/uL    Imaging Review No results found.   EKG Interpretation None      MDM   Final diagnoses:  Myalgia  Arthralgia    Patient desires to sign out AMA. Lab work is not completed. Patient's symptoms seem to be consistent with a connective tissue disorder or arthritis-type disorder. Patient has not had a complete workup for that yet. Very thin rules out negative it's possible it could be a fibromyalgia type complaint. Patient states she can't stay any longer. Recommend that she continue  her Naprosyn 500 mg twice a day along with her other medicines. [facility] for workup for arthritis or connective tissue disorder. Patient knows that she can return at any time. Patient says that she has to leave now and cannot wait for lab results.  Was able to let patient know that no recurrent significant anemia requiring transfusion again at this time.  Fredia Sorrow, MD 03/24/14 1336

## 2014-04-27 ENCOUNTER — Other Ambulatory Visit (HOSPITAL_BASED_OUTPATIENT_CLINIC_OR_DEPARTMENT_OTHER): Payer: Medicaid Other

## 2014-04-27 ENCOUNTER — Telehealth: Payer: Self-pay | Admitting: Oncology

## 2014-04-27 ENCOUNTER — Ambulatory Visit (HOSPITAL_BASED_OUTPATIENT_CLINIC_OR_DEPARTMENT_OTHER): Payer: Medicaid Other | Admitting: Oncology

## 2014-04-27 ENCOUNTER — Encounter: Payer: Self-pay | Admitting: Oncology

## 2014-04-27 VITALS — BP 135/78 | HR 73 | Temp 99.0°F | Resp 18 | Ht 64.0 in | Wt 166.0 lb

## 2014-04-27 DIAGNOSIS — D500 Iron deficiency anemia secondary to blood loss (chronic): Secondary | ICD-10-CM

## 2014-04-27 LAB — COMPREHENSIVE METABOLIC PANEL (CC13)
ALT: 11 U/L (ref 0–55)
ANION GAP: 8 meq/L (ref 3–11)
AST: 10 U/L (ref 5–34)
Albumin: 3.1 g/dL — ABNORMAL LOW (ref 3.5–5.0)
Alkaline Phosphatase: 82 U/L (ref 40–150)
BUN: 13.1 mg/dL (ref 7.0–26.0)
CALCIUM: 9.3 mg/dL (ref 8.4–10.4)
CO2: 25 meq/L (ref 22–29)
CREATININE: 0.7 mg/dL (ref 0.6–1.1)
Chloride: 107 mEq/L (ref 98–109)
Glucose: 110 mg/dl (ref 70–140)
Potassium: 4.1 mEq/L (ref 3.5–5.1)
Sodium: 140 mEq/L (ref 136–145)
Total Bilirubin: <0.2 mg/dL (ref 0.20–1.20)
Total Protein: 8.3 g/dL (ref 6.4–8.3)

## 2014-04-27 LAB — IRON AND TIBC CHCC
%SAT: 15 % — ABNORMAL LOW (ref 21–57)
IRON: 37 ug/dL — AB (ref 41–142)
TIBC: 251 ug/dL (ref 236–444)
UIBC: 214 ug/dL (ref 120–384)

## 2014-04-27 LAB — CBC WITH DIFFERENTIAL/PLATELET
BASO%: 0.1 % (ref 0.0–2.0)
BASOS ABS: 0 10*3/uL (ref 0.0–0.1)
EOS%: 0.7 % (ref 0.0–7.0)
Eosinophils Absolute: 0.1 10*3/uL (ref 0.0–0.5)
HEMATOCRIT: 32 % — AB (ref 34.8–46.6)
HGB: 10.6 g/dL — ABNORMAL LOW (ref 11.6–15.9)
LYMPH%: 15.5 % (ref 14.0–49.7)
MCH: 25.1 pg (ref 25.1–34.0)
MCHC: 33.1 g/dL (ref 31.5–36.0)
MCV: 75.8 fL — ABNORMAL LOW (ref 79.5–101.0)
MONO#: 0.5 10*3/uL (ref 0.1–0.9)
MONO%: 6.4 % (ref 0.0–14.0)
NEUT#: 5.6 10*3/uL (ref 1.5–6.5)
NEUT%: 77.3 % — AB (ref 38.4–76.8)
PLATELETS: 376 10*3/uL (ref 145–400)
RBC: 4.22 10*6/uL (ref 3.70–5.45)
RDW: 14.1 % (ref 11.2–14.5)
WBC: 7.2 10*3/uL (ref 3.9–10.3)
lymph#: 1.1 10*3/uL (ref 0.9–3.3)

## 2014-04-27 LAB — FERRITIN CHCC: Ferritin: 73 ng/ml (ref 9–269)

## 2014-04-27 MED ORDER — FERROUS SULFATE 325 (65 FE) MG PO TABS: 325.0000 mg | ORAL_TABLET | Freq: Every day | ORAL | Status: AC

## 2014-04-27 NOTE — Telephone Encounter (Signed)
Pt confirmed labs/ov per 10/13 POF, gave pt AVS.... KJ °

## 2014-04-27 NOTE — Progress Notes (Signed)
Hematology and Oncology Follow Up Visit  Kimberly Grimes [id number] 21-Feb-1971 43 y.o. 04/27/2014 9:59 AM Kimberly Grimes, [name], Denton Meek, *   Principle Diagnosis: 43 year old women with iron deficiency anemia due to heavy menstrual bleeding diagnosed in 2013.  Prior Therapy: Feraheme 1,020 mg IV on 12/12/11 and repeated on 02/22/12 and 01/2014.   Current therapy: Watchful observation  Interim History: [name] presents for follow-up today. Since the last visit, she received IV iron infusion in July of 2015. She tolerated it well No infusion-related reaction. Her symptoms improved significantly since that time. She remains slightly fatigued however. Appetite has been increased since the iron infusion. She reports that her menstrual cycles have improved and her bleeding has diminished. Denies chest pain, shortness of breath, and dyspnea. No abdominal pain, nausea, or vomiting. Denies hematuria and melena. She reports chronic knee pain that requires steroid injections a times. Rest of her review of systems unremarkable.  Medications: I have reviewed the patient's current medications.   Current Outpatient Prescriptions  Medication Sig Dispense Refill  . albuterol (PROVENTIL HFA;VENTOLIN HFA) 108 (90 BASE) MCG/ACT inhaler Inhale 2 puffs into the lungs every 6 (six) hours as needed. For shortness of breath      . ferrous sulfate 325 (65 FE) MG tablet Take 1 tablet (325 mg total) by mouth daily with breakfast.  90 tablet  3  . HYDROCODONE-ACETAMINOPHEN PO Take 1 tablet by mouth every 6 (six) hours as needed (for pain).      Marland Kitchen ibuprofen (ADVIL,MOTRIN) 200 MG tablet Take 200 mg by mouth every 6 (six) hours as needed for moderate pain.      . naproxen (NAPROSYN) 500 MG tablet Take 500 mg by mouth 2 (two) times daily as needed for moderate pain.       No current facility-administered medications for this visit.    Allergies:  Allergies  Allergen Reactions  . Decadron [Dexamethasone]  Other (See Comments)    Vaginal itching and burning reaction.  . Tylenol [Acetaminophen] Other (See Comments)    Unknown reaction    Past Medical History, Surgical history, Social history, and Family History were reviewed and updated.  Marland Kitchen  Physical Exam: Blood pressure 135/78, pulse 73, temperature 99 F (37.2 C), temperature source Oral, resp. rate 18, height 5\' 4"  (1.626 m), weight 166 lb (75.297 kg). ECOG: 0 General appearance: alert, cooperative and no distress less palel at this time Head: Normocephalic, without obvious abnormality, atraumatic Neck: no adenopathy Lymph nodes: Cervical, supraclavicular, and axillary nodes normal. Heart:regular rate and rhythm, S1, S2 normal, no murmur, click, rub or gallop Lung: clear to auscultation Abdomen: soft, non-tender, without masses or organomegaly EXT:no erythema, induration, or nodules   Lab Results: Lab Results  Component Value Date   WBC 7.2 04/27/2014   HGB 10.6* 04/27/2014   HCT 32.0* 04/27/2014   MCV 75.8* 04/27/2014   PLT 376 04/27/2014     Chemistry      Component Value Date/Time   NA 140 03/24/2014 1223   K 3.9 03/24/2014 1223   CL 104 03/24/2014 1223   CO2 25 03/24/2014 1223   BUN 9 03/24/2014 1223   CREATININE 0.60 03/24/2014 1223      Component Value Date/Time   CALCIUM 8.6 03/24/2014 1223   ALKPHOS 92 01/13/2014 1955   AST 20 01/13/2014 1955   ALT 8 01/13/2014 1955   BILITOT <0.2* 01/13/2014 1955      Impression and Plan: This is a 43 year old female with the  following issues:  1. Iron deficiency anemia. The patient has received 3 doses of Feraheme. She tolerated the infusion well without any infusion-related toxicity. Her hemoglobin is improved today to 10.6 and she is minimally symptomatic. I will check your iron levels today and repeat in 3 months. I will supplement her with IV iron as needed. I also gave her a prescription for oral iron.  2. Follow-up in about 3 months to recheck blood  counts.     Jaidin Richison 10/13/20159:59 AM

## 2014-05-17 ENCOUNTER — Encounter: Payer: Self-pay | Admitting: Oncology

## 2014-07-26 ENCOUNTER — Telehealth: Payer: Self-pay | Admitting: Oncology

## 2014-07-26 NOTE — Telephone Encounter (Signed)
Pt called/confirmed labs/ov due to conflict with another apt.Marland Kitchen KJ

## 2014-07-28 ENCOUNTER — Other Ambulatory Visit: Payer: Medicaid Other

## 2014-07-28 ENCOUNTER — Ambulatory Visit: Payer: Medicaid Other | Admitting: Physician Assistant

## 2014-08-09 ENCOUNTER — Other Ambulatory Visit: Payer: Medicaid Other

## 2014-08-09 ENCOUNTER — Telehealth: Payer: Self-pay | Admitting: Oncology

## 2014-08-09 ENCOUNTER — Ambulatory Visit: Payer: Medicaid Other | Admitting: Oncology

## 2014-08-09 NOTE — Telephone Encounter (Signed)
Pt called to cancel due to weather will c/b to r/s .... KJ

## 2014-11-06 ENCOUNTER — Emergency Department (HOSPITAL_COMMUNITY)
Admission: EM | Admit: 2014-11-06 | Discharge: 2014-11-06 | Disposition: A | Discharge status: Home / Self Care | Payer: Medicaid Other | Attending: Emergency Medicine | Admitting: Emergency Medicine

## 2014-11-06 ENCOUNTER — Encounter (HOSPITAL_COMMUNITY): Payer: Self-pay | Admitting: Nurse Practitioner

## 2014-11-06 DIAGNOSIS — Z8679 Personal history of other diseases of the circulatory system: Secondary | ICD-10-CM | POA: Insufficient documentation

## 2014-11-06 DIAGNOSIS — Z8659 Personal history of other mental and behavioral disorders: Secondary | ICD-10-CM | POA: Insufficient documentation

## 2014-11-06 DIAGNOSIS — R011 Cardiac murmur, unspecified: Secondary | ICD-10-CM | POA: Diagnosis not present

## 2014-11-06 DIAGNOSIS — Z79899 Other long term (current) drug therapy: Secondary | ICD-10-CM | POA: Diagnosis not present

## 2014-11-06 DIAGNOSIS — Z8739 Personal history of other diseases of the musculoskeletal system and connective tissue: Secondary | ICD-10-CM

## 2014-11-06 DIAGNOSIS — M79671 Pain in right foot: Secondary | ICD-10-CM | POA: Diagnosis not present

## 2014-11-06 DIAGNOSIS — M069 Rheumatoid arthritis, unspecified: Secondary | ICD-10-CM | POA: Insufficient documentation

## 2014-11-06 DIAGNOSIS — D649 Anemia, unspecified: Secondary | ICD-10-CM | POA: Insufficient documentation

## 2014-11-06 MED ORDER — HYDROCODONE-ACETAMINOPHEN 5-325 MG PO TABS
1.0000 | ORAL_TABLET | Freq: Once | ORAL | Status: AC
  Administered 2014-11-06: 1 via ORAL
  Filled 2014-11-06: qty 1

## 2014-11-06 MED ORDER — HYDROCODONE-ACETAMINOPHEN 5-325 MG PO TABS: 1.0000 | ORAL_TABLET | Freq: Four times a day (QID) | ORAL | Status: DC | PRN

## 2014-11-06 MED ORDER — NAPROXEN 500 MG PO TABS: 500.0000 mg | ORAL_TABLET | Freq: Two times a day (BID) | ORAL | Status: AC | PRN

## 2014-11-06 NOTE — ED Provider Notes (Signed)
CSN: [id number]     Arrival date & time 11/06/14  1746 History  This chart was scribed for [employer], PA-C, working with Blanchie Dessert, MD by Steva Colder, ED Scribe. The patient was seen in room TR09C/TR09C at 6:59 PM.    Chief Complaint  Patient presents with  . Foot Pain      Patient is a 44 y.o. female presenting with lower extremity pain. The history is provided by the patient. No language interpreter was used.  Foot Pain This is a chronic problem. The current episode started more than 1 week ago. The problem occurs constantly. The problem has not changed since onset.Pertinent negatives include no chest pain, no abdominal pain and no shortness of breath. The symptoms are aggravated by walking. Nothing relieves the symptoms. She has tried a cold compress (rubbing alcohol and prednisone) for the symptoms.    HPI Comments: Kimberly Grimes is a 44 y.o. female with a medical hx of RA who presents to the Emergency Department complaining of right foot pain onset a couple days ago. Pt thinks that this is her arthritis flaring up. Pt denies falling or twisting her ankle. Pt typically receives humira injections from [facility] and her last injection was 2 weeks ago. Pt rates her pain as 10/10, constant, aching, located at the top of the right foot, non-radiating. Pt is not able to bear weight on it because of the pain. She states that she has tried ice, rubbing alcohol, prednisone, cream with no relief for her symptoms. She states that she is having associated symptoms of right foot swelling and subjective warmth. Pt has not used heat for her foot. She denies numbness, tingling, weakness, fevers, CP, SOB, leg swelling, color change, and any other symptoms. Pt notes that she is allergic to Tylenol and it burns her stomach.   Past Medical History  Diagnosis Date  . Varicose veins   . Sickle cell trait   . History of blood clots     Blood clots during menstrual cycle  . Muscle spasms of  lower extremity   . Panic attack   . Heart murmur   . Anemia 12/11/2011  . Iron deficiency anemia   . History of blood transfusion 01/13/2014    "related to menses"  . Rheumatoid arthritis(714.0)   . Arthritis     "all over" (01/14/2014)   Past Surgical History  Procedure Laterality Date  . Tubal ligation  2007   Family History  Problem Relation Age of Onset  . Rheum arthritis Mother    History  Substance Use Topics  . Smoking status: Never Smoker   . Smokeless tobacco: Never Used  . Alcohol Use: Yes     Comment: 01/14/2014 "might have a drink or 2 couple times/yr, if that"   OB History    Gravida Para Term Preterm AB TAB SAB Ectopic Multiple Living   4        1 5      Review of Systems  Constitutional: Negative for fever.  Respiratory: Negative for shortness of breath.   Cardiovascular: Negative for chest pain and leg swelling.  Gastrointestinal: Negative for nausea, vomiting, abdominal pain and diarrhea.  Musculoskeletal: Positive for joint swelling and arthralgias (right foot).  Skin: Negative for color change and wound.  Neurological: Negative for weakness and numbness.   A complete 10 system review of systems was obtained and all systems are negative except as noted in the HPI and PMH.     Allergies  Decadron and  Tylenol  Home Medications   Prior to Admission medications   Medication Sig Start Date End Date Taking? Authorizing Provider  albuterol (PROVENTIL HFA;VENTOLIN HFA) 108 (90 BASE) MCG/ACT inhaler Inhale 2 puffs into the lungs every 6 (six) hours as needed. For shortness of breath    Historical Provider, MD  ferrous sulfate 325 (65 FE) MG tablet Take 1 tablet (325 mg total) by mouth daily with breakfast. 04/27/14   Wyatt Portela, MD  HYDROCODONE-ACETAMINOPHEN PO Take 1 tablet by mouth every 6 (six) hours as needed (for pain).    Historical Provider, MD  ibuprofen (ADVIL,MOTRIN) 200 MG tablet Take 200 mg by mouth every 6 (six) hours as needed for moderate  pain.    Historical Provider, MD  naproxen (NAPROSYN) 500 MG tablet Take 500 mg by mouth 2 (two) times daily as needed for moderate pain.    Historical Provider, MD   BP 143/77 mmHg  Pulse 68  Temp(Src) 98.9 F (37.2 C)  Resp 18  Ht 5\' 4"  (1.626 m)  Wt 170 lb (77.111 kg)  BMI 29.17 kg/m2  SpO2 97%  Physical Exam  Constitutional: She is oriented to person, place, and time. Vital signs are normal. She appears well-developed and well-nourished.  Non-toxic appearance. No distress.  Afebrile, nontoxic, NAD  HENT:  Head: Normocephalic and atraumatic.  Mouth/Throat: Mucous membranes are normal.  Eyes: Conjunctivae and EOM are normal. Right eye exhibits no discharge. Left eye exhibits no discharge.  Neck: Normal range of motion. Neck supple.  Cardiovascular: Normal rate and intact distal pulses.   Pulmonary/Chest: Effort normal. No respiratory distress.  Abdominal: Normal appearance. She exhibits no distension.  Musculoskeletal: Normal range of motion.       Right foot: There is tenderness. There is normal range of motion, no swelling and normal capillary refill.       Feet:  MAE x4 Strength and sensation grossly intact Distal pulses intact No pedal edema, neg homan's bilaterally Right foot: mild TTP to the dorsal aspect of the mid-foot diffusely over all metatarsals, no deformity or swelling, no erythema or warmth. Wiggles all digits.  Right ankle: non-TTP with no deformities. Cap refill brisk and present.   Neurological: She is alert and oriented to person, place, and time. She has normal strength. No sensory deficit.  Skin: Skin is warm, dry and intact. No rash noted. No erythema.  No erythema  Psychiatric: She has a normal mood and affect. Her behavior is normal.  Nursing note and vitals reviewed.   ED Course  Procedures (including critical care time) DIAGNOSTIC STUDIES: Oxygen Saturation is 97% on RA, nl by my interpretation.    COORDINATION OF CARE: 7:03 PM-Discussed  treatment plan which includes pain medication, f/u with doctor treating RA, with pt at bedside and pt agreed to plan.   Labs Review Labs Reviewed - No data to display  Imaging Review No results found.   EKG Interpretation None      MDM   Final diagnoses:  Right foot pain  History of rheumatoid arthritis    44 y.o. female here with flare of RA in R foot. No erythema or warmth, neurovascularly intact with soft compartments, doubt gout or septic joint, doubt DVT, likely just RA flare. Doubt need for xray given no trauma, and no focal bony tenderness. Will give pain meds here and short course of pain meds, discussed importance of f/up with PCP for her humira shots and ongoing management of her chronic pain. I explained the diagnosis and  have given explicit precautions to return to the ER including for any other new or worsening symptoms. The patient understands and accepts the medical plan as it's been dictated and I have answered their questions. Discharge instructions concerning home care and prescriptions have been given. The patient is STABLE and is discharged to home in good condition.   I personally performed the services described in this documentation, which was scribed in my presence. The recorded information has been reviewed and is accurate.  BP 143/77 mmHg  Pulse 68  Temp(Src) 98.9 F (37.2 C)  Resp 18  Ht 5\' 4"  (1.626 m)  Wt 170 lb (77.111 kg)  BMI 29.17 kg/m2  SpO2 97%  Meds ordered this encounter  Medications  . HYDROcodone-acetaminophen (NORCO/VICODIN) 5-325 MG per tablet 1 tablet    Sig:   . HYDROcodone-acetaminophen (NORCO) 5-325 MG per tablet    Sig: Take 1 tablet by mouth every 6 (six) hours as needed for severe pain.    Dispense:  10 tablet    Refill:  0    Order Specific Question:  Supervising Provider    Answer:  Sabra Heck, BRIAN [[id number]]  . naproxen (NAPROSYN) 500 MG tablet    Sig: Take 1 tablet (500 mg total) by mouth 2 (two) times daily as needed for  mild pain, moderate pain or headache (TAKE WITH MEALS.).    Dispense:  20 tablet    Refill:  0    Order Specific Question:  Supervising Provider    Answer:  Noemi Chapel [[id number]]     Romy Ipock Camprubi-Soms, PA-C 11/06/14 1952  Blanchie Dessert, MD 11/07/14 0010

## 2014-11-06 NOTE — Discharge Instructions (Signed)
Use crutches as needed for comfort. Ice/heat and elevate foot throughout the day. Alternate between naprosyn and norco for pain relief. Do not drive or operate machinery with pain medication use. Call your regular doctor for ongoing management of your chronic pain, and for your humira medication. Return to the ER for changes or worsening symptoms.    Musculoskeletal Pain Musculoskeletal pain is muscle and boney aches and pains. These pains can occur in any part of the body. Your caregiver may treat you without knowing the cause of the pain. They may treat you if blood or urine tests, X-rays, and other tests were normal.  CAUSES There is often not a definite cause or reason for these pains. These pains may be caused by a type of germ (virus). The discomfort may also come from overuse. Overuse includes working out too hard when your body is not fit. Boney aches also come from weather changes. Bone is sensitive to atmospheric pressure changes. HOME CARE INSTRUCTIONS   Ask when your test results will be ready. Make sure you get your test results.  Only take over-the-counter or prescription medicines for pain, discomfort, or fever as directed by your caregiver. If you were given medications for your condition, do not drive, operate machinery or power tools, or sign legal documents for 24 hours. Do not drink alcohol. Do not take sleeping pills or other medications that may interfere with treatment.  Continue all activities unless the activities cause more pain. When the pain lessens, slowly resume normal activities. Gradually increase the intensity and duration of the activities or exercise.  During periods of severe pain, bed rest may be helpful. Lay or sit in any position that is comfortable.  Putting ice on the injured area.  Put ice in a bag.  Place a towel between your skin and the bag.  Leave the ice on for 15 to 20 minutes, 3 to 4 times a day.  Follow up with your caregiver for continued  problems and no reason can be found for the pain. If the pain becomes worse or does not go away, it may be necessary to repeat tests or do additional testing. Your caregiver may need to look further for a possible cause. SEEK IMMEDIATE MEDICAL CARE IF:  You have pain that is getting worse and is not relieved by medications.  You develop chest pain that is associated with shortness or breath, sweating, feeling sick to your stomach (nauseous), or throw up (vomit).  Your pain becomes localized to the abdomen.  You develop any new symptoms that seem different or that concern you. MAKE SURE YOU:   Understand these instructions.  Will watch your condition.  Will get help right away if you are not doing well or get worse. Document Released: 07/02/2005 Document Revised: 09/24/2011 Document Reviewed: 03/06/2013 [facility] Patient Information 2015 [place], [place]. This information is not intended to replace advice given to you by your health care provider. Make sure you discuss any questions you have with your health care provider.  Heat Therapy Heat therapy can help ease sore, stiff, injured, and tight muscles and joints. Heat relaxes your muscles, which may help ease your pain.  RISKS AND COMPLICATIONS If you have any of the following conditions, do not use heat therapy unless your health care provider has approved:  Poor circulation.  Healing wounds or scarred skin in the area being treated.  Diabetes, heart disease, or high blood pressure.  Not being able to feel (numbness) the area being treated.  Unusual swelling of the area being treated.  Active infections.  Blood clots.  Cancer.  Inability to communicate pain. This may include young children and people who have problems with their brain function (dementia).  Pregnancy. Heat therapy should only be used on old, pre-existing, or long-lasting (chronic) injuries. Do not use heat therapy on new injuries unless directed by your  health care provider. HOW TO USE HEAT THERAPY There are several different kinds of heat therapy, including:  Moist heat pack.  Warm water bath.  Hot water bottle.  Electric heating pad.  Heated gel pack.  Heated wrap.  Electric heating pad. Use the heat therapy method suggested by your health care provider. Follow your health care provider's instructions on when and how to use heat therapy. GENERAL HEAT THERAPY RECOMMENDATIONS  Do not sleep while using heat therapy. Only use heat therapy while you are awake.  Your skin may turn pink while using heat therapy. Do not use heat therapy if your skin turns red.  Do not use heat therapy if you have new pain.  High heat or long exposure to heat can cause burns. Be careful when using heat therapy to avoid burning your skin.  Do not use heat therapy on areas of your skin that are already irritated, such as with a rash or sunburn. SEEK MEDICAL CARE IF:  You have blisters, redness, swelling, or numbness.  You have new pain.  Your pain is worse. MAKE SURE YOU:  Understand these instructions.  Will watch your condition.  Will get help right away if you are not doing well or get worse. Document Released: 09/24/2011 Document Revised: 11/16/2013 Document Reviewed: 08/25/2013 [facility] Patient Information 2015 [place], [place]. This information is not intended to replace advice given to you by your health care provider. Make sure you discuss any questions you have with your health care provider.

## 2014-11-06 NOTE — ED Notes (Signed)
Last Humara injection 2 weeks ago. Pt goes to [facility] for treatment.

## 2014-11-06 NOTE — ED Notes (Signed)
shes had R foot pain and swelling for a few days, denies any injuries. Tried ice, rubbing alcohol, prednison with no relief.

## 2014-11-06 NOTE — ED Notes (Signed)
Pt reports She is out of Humara/humira injections. Pt reports having RA in the RT foot . Pt reports pain is worse since her last shot.

## 2019-03-28 ENCOUNTER — Other Ambulatory Visit: Payer: Self-pay

## 2019-03-28 ENCOUNTER — Emergency Department (HOSPITAL_BASED_OUTPATIENT_CLINIC_OR_DEPARTMENT_OTHER): Payer: Medicaid - Out of State

## 2019-03-28 ENCOUNTER — Emergency Department (HOSPITAL_BASED_OUTPATIENT_CLINIC_OR_DEPARTMENT_OTHER)
Admission: EM | Admit: 2019-03-28 | Discharge: 2019-03-28 | Disposition: A | Discharge status: Home / Self Care | Payer: Medicaid - Out of State | Attending: Emergency Medicine | Admitting: Emergency Medicine

## 2019-03-28 ENCOUNTER — Encounter (HOSPITAL_BASED_OUTPATIENT_CLINIC_OR_DEPARTMENT_OTHER): Payer: Self-pay

## 2019-03-28 DIAGNOSIS — W231XXA Caught, crushed, jammed, or pinched between stationary objects, initial encounter: Secondary | ICD-10-CM | POA: Diagnosis not present

## 2019-03-28 DIAGNOSIS — Y999 Unspecified external cause status: Secondary | ICD-10-CM | POA: Diagnosis not present

## 2019-03-28 DIAGNOSIS — S6702XA Crushing injury of left thumb, initial encounter: Secondary | ICD-10-CM | POA: Diagnosis not present

## 2019-03-28 DIAGNOSIS — Z79899 Other long term (current) drug therapy: Secondary | ICD-10-CM | POA: Diagnosis not present

## 2019-03-28 DIAGNOSIS — Y9389 Activity, other specified: Secondary | ICD-10-CM | POA: Diagnosis not present

## 2019-03-28 DIAGNOSIS — Z23 Encounter for immunization: Secondary | ICD-10-CM | POA: Insufficient documentation

## 2019-03-28 DIAGNOSIS — Y9259 Other trade areas as the place of occurrence of the external cause: Secondary | ICD-10-CM | POA: Insufficient documentation

## 2019-03-28 DIAGNOSIS — M069 Rheumatoid arthritis, unspecified: Secondary | ICD-10-CM | POA: Diagnosis not present

## 2019-03-28 DIAGNOSIS — S6992XA Unspecified injury of left wrist, hand and finger(s), initial encounter: Secondary | ICD-10-CM | POA: Diagnosis present

## 2019-03-28 MED ORDER — TETANUS-DIPHTH-ACELL PERTUSSIS 5-2.5-18.5 LF-MCG/0.5 IM SUSP
0.5000 mL | Freq: Once | INTRAMUSCULAR | Status: AC
  Administered 2019-03-28: 0.5 mL via INTRAMUSCULAR
  Filled 2019-03-28: qty 0.5

## 2019-03-28 MED ORDER — LIDOCAINE-EPINEPHRINE-TETRACAINE (LET) SOLUTION
3.0000 mL | Freq: Once | NASAL | Status: AC
  Administered 2019-03-28: 3 mL via TOPICAL
  Filled 2019-03-28: qty 3

## 2019-03-28 MED ORDER — HYDROCODONE-ACETAMINOPHEN 5-325 MG PO TABS
1.0000 | ORAL_TABLET | Freq: Once | ORAL | Status: DC
  Filled 2019-03-28: qty 1

## 2019-03-28 MED ORDER — HYDROCODONE-ACETAMINOPHEN 5-325 MG PO TABS: 1.0000 | ORAL_TABLET | Freq: Four times a day (QID) | ORAL | 0 refills | Status: AC | PRN

## 2019-03-28 MED ORDER — CEPHALEXIN 500 MG PO CAPS: 500.0000 mg | ORAL_CAPSULE | Freq: Four times a day (QID) | ORAL | 0 refills | Status: AC

## 2019-03-28 MED ORDER — CEPHALEXIN 250 MG PO CAPS
250.0000 mg | ORAL_CAPSULE | Freq: Once | ORAL | Status: AC
  Administered 2019-03-28: 23:00:00 250 mg via ORAL
  Filled 2019-03-28: qty 1

## 2019-03-28 NOTE — ED Notes (Signed)
ED Provider at bedside. 

## 2019-03-28 NOTE — ED Triage Notes (Signed)
Pt slammed her L thumb in a door. Pt has lacs to the thumb.

## 2019-03-28 NOTE — Discharge Instructions (Signed)
Please take Ibuprofen (Advil, motrin) and Tylenol (acetaminophen) to relieve your pain.  You may take up to 600 MG (3 pills) of normal strength ibuprofen every 8 hours as needed.  In between doses of ibuprofen you make take tylenol, up to 1,000 mg (two extra strength pills).  Do not take more than 3,000 mg tylenol in a 24 hour period.  Please check all medication labels as many medications such as pain and cold medications may contain tylenol.  Do not drink alcohol while taking these medications.  Do not take other NSAID'S while taking ibuprofen (such as aleve or naproxen).  Please take ibuprofen with food to decrease stomach upset.  You may have diarrhea from the antibiotics.  It is very important that you continue to take the antibiotics even if you get diarrhea unless a medical professional tells you that you may stop taking them.  If you stop too early the bacteria you are being treated for will become stronger and you may need different, more powerful antibiotics that have more side effects and worsening diarrhea.  Please stay well hydrated and consider probiotics as they may decrease the severity of your diarrhea.  Please be aware that if you take any hormonal contraception (birth control pills, nexplanon, the ring, etc) that your birth control will not work while you are taking antibiotics and you need to use back up protection as directed on the birth control medication information insert.   While in the emergency room today your blood pressure was high.  This may be to being in pain or being in the emergency room however I would recommend that you get a primary care doctor and get your blood pressure rechecked in the next 2 to 3 weeks as if it remains high you may need medication.  Please take MiraLAX once a day or a stool softener while you are taking narcotic medicine as it can make you constipated.

## 2019-03-28 NOTE — ED Provider Notes (Signed)
[facility] EMERGENCY DEPARTMENT Provider Note   CSN: [id number] Arrival date & time: 03/28/19  2007     History   Chief Complaint Chief Complaint  Patient presents with  . Finger Injury    HPI Kimberly Grimes is a 48 y.o. female who presents today for evaluation of shutting her left thumb in the door shortly prior to arrival.  She reports that she is staying at a hotel and she went to let her dog out and in the commotion accidentally slammed her left thumb in the door.  She is right-hand dominant.  She denies any other injuries.  She is unsure when her last tetanus shot was however she suspects that it is over 10 years ago.    Of note she is immunosuppressed with methotrexate and prednisone for her rheumatoid arthritis.     HPI  Past Medical History:  Diagnosis Date  . Anemia 12/11/2011  . Arthritis    "all over" (01/14/2014)  . Heart murmur   . History of blood clots    Blood clots during menstrual cycle  . History of blood transfusion 01/13/2014   "related to menses"  . Iron deficiency anemia   . Muscle spasms of lower extremity   . Panic attack   . Rheumatoid arthritis(714.0)   . Sickle cell trait ([place])   . Varicose veins     Patient Active Problem List   Diagnosis Date Noted  . History of rheumatoid arthritis 01/14/2014  . Plantar fasciitis, bilateral 01/14/2014  . H/O: menorrhagia 01/14/2014  . Weakness 01/14/2014  . Trichomoniasis 01/14/2014  . Microcytic anemia 01/14/2014  . Arthralgia 01/13/2014  . Excessive or frequent menstruation 12/18/2011    Past Surgical History:  Procedure Laterality Date  . TUBAL LIGATION  2007     OB History    Gravida  4   Para      Term      Preterm      AB      Living  5     SAB      TAB      Ectopic      Multiple  1   Live Births               Home Medications    Prior to Admission medications   Medication Sig Start Date End Date Taking? Authorizing Provider  albuterol  (PROVENTIL HFA;VENTOLIN HFA) 108 (90 BASE) MCG/ACT inhaler Inhale 2 puffs into the lungs every 6 (six) hours as needed. For shortness of breath    [provider]  cephALEXin (KEFLEX) 500 MG capsule Take 1 capsule (500 mg total) by mouth 4 (four) times daily for 7 days. 03/28/19 04/04/19  Lorin Glass, PA-C  ferrous sulfate 325 (65 FE) MG tablet Take 1 tablet (325 mg total) by mouth daily with breakfast. 04/27/14   Wyatt Portela, MD  HYDROcodone-acetaminophen (NORCO/VICODIN) 5-325 MG tablet Take 1 tablet by mouth every 6 (six) hours as needed for severe pain. 03/28/19   Lorin Glass, PA-C  ibuprofen (ADVIL,MOTRIN) 200 MG tablet Take 200 mg by mouth every 6 (six) hours as needed for moderate pain.    [provider]  naproxen (NAPROSYN) 500 MG tablet Take 500 mg by mouth 2 (two) times daily as needed for moderate pain.    [provider]  naproxen (NAPROSYN) 500 MG tablet Take 1 tablet (500 mg total) by mouth 2 (two) times daily as needed for mild pain, moderate pain or  headache (TAKE WITH MEALS.). 11/06/14   Street, [place], PA-C    Family History Family History  Problem Relation Age of Onset  . Rheum arthritis Mother     Social History Social History   Tobacco Use  . Smoking status: Never Smoker  . Smokeless tobacco: Never Used  Substance Use Topics  . Alcohol use: Yes    Comment: 01/14/2014 "might have a drink or 2 couple times/yr, if that"  . Drug use: No     Allergies   Decadron [dexamethasone] and Tylenol [acetaminophen]   Review of Systems Review of Systems  Constitutional: Negative for chills and fever.  Skin: Positive for wound.  Neurological: Negative for weakness and numbness.  All other systems reviewed and are negative.    Physical Exam Updated Vital Signs BP (!) 155/83 (BP Location: Left Arm)   Pulse 68   Temp 99.5 F (37.5 C) (Oral)   Resp 20   Ht 5\' 4"  (1.626 m)   Wt 83.9 kg   LMP 03/17/2019   SpO2 100%   BMI  31.76 kg/m   Physical Exam Vitals signs and nursing note reviewed.  Constitutional:      General: She is not in acute distress.    Appearance: She is not ill-appearing.  HENT:     Head: Normocephalic.  Cardiovascular:     Rate and Rhythm: Normal rate.  Pulmonary:     Effort: Pulmonary effort is normal. No respiratory distress.  Musculoskeletal:     Comments: There is mild tenderness to palpation diffusely along the distal aspect of the left thumb.  There is no crepitus or deformity palpated.  She has full active range of motion of the left thumb.  Skin:    Comments: There is a 1 cm V shaped superficial skin flap present on the volar aspect of the left thumb distal to the DIP joint.    Neurological:     Mental Status: She is alert.     Comments: Sensation intact to left thumb.      ED Treatments / Results  Labs (all labs ordered are listed, but only abnormal results are displayed) Labs Reviewed - No data to display  EKG None  Radiology Dg Hand Complete Left  Result Date: 03/28/2019 CLINICAL DATA:  Patient with injury to the distal aspect of the thumb. EXAM: LEFT HAND - COMPLETE 3+ VIEW COMPARISON:  None. FINDINGS: Normal anatomic alignment. No evidence for acute fracture or dislocation. Regional soft tissues unremarkable. IMPRESSION: No acute osseous abnormality. Electronically Signed   By: Lovey Newcomer M.D.   On: 03/28/2019 20:46    Procedures Irrigation and debridement  Date/Time: 03/28/2019 10:37 PM Performed by: Lorin Glass, PA-C Authorized by: Lorin Glass, PA-C  Consent: Verbal consent obtained. Consent given by: patient Local anesthesia used: yes  Anesthesia: Local anesthesia used: yes Local Anesthetic: LET (lido, epi, tetracaine)  Sedation: Patient sedated: no  Patient tolerance: patient tolerated the procedure well with no immediate complications Comments: Had discussion with patient of wound treatment options.  She does not wish for  sutures unless they are absolutely necessary.  Scissors were used to remove the superficial devitalized skin flap to avoid trapping bacteria.  Wound was irrigated with 500 cc normal saline under pressure.  Wound was viewed in a clear bloodless field without evidence of foreign bodies.  Hemostasis was obtained with pressure.    (including critical care time)  Medications Ordered in ED Medications  HYDROcodone-acetaminophen (NORCO/VICODIN) 5-325 MG per tablet 1 tablet (  has no administration in time range)  Tdap (BOOSTRIX) injection 0.5 mL (0.5 mLs Intramuscular Given 03/28/19 2123)  lidocaine-EPINEPHrine-tetracaine (LET) solution (3 mLs Topical Given 03/28/19 2137)  cephALEXin (KEFLEX) capsule 250 mg (250 mg Oral Given 03/28/19 2238)     Initial Impression / Assessment and Plan / ED Course  I have reviewed the triage vital signs and the nursing notes.  Pertinent labs & imaging results that were available during my care of the patient were reviewed by me and considered in my medical decision making (see chart for details).        Patient presents today for evaluation of a thumb injury that occurred shortly prior to arrival.  On exam she has a 1 cm V-shaped laceration to the volar aspect of her left thumb.  X-rays were obtained without evidence of fracture or other acute abnormality.  She is unsure when her last tetanus shot was therefore Tdap was updated today.  We had discussion of possible treatment options including risks and benefits of them.  She elected for no wound closure which I feel is a reasonable decision based on the size and superficial nature of the wound.  Wound was irrigated and devitalized tissue was debrided.    Wound was dressed with antibiotic ointment, nonstick dressing and bulky dressing.  As she is immunosuppressed with prednisone and methotrexate due to her rheumatoid arthritis we will give prescription for Keflex.  She is given a dose of Vicodin while in the emergency  room.  She has Tylenol listed as an allergy however her allergy to that is upset stomach and she still wishes to have Vicodin.  PMP is consulted and she is given a prescription for 3 Vicodin at home if needed.  We discussed alternative measures pain control.  She was made aware that her blood pressure was elevated while in the emergency room that she needs to follow-up to get this rechecked in the next 1 to 2 weeks.  Return precautions were discussed with patient who states their understanding.  At the time of discharge patient denied any unaddressed complaints or concerns.  Patient is agreeable for discharge home.   Final Clinical Impressions(s) / ED Diagnoses   Final diagnoses:  Injury of finger of left hand, initial encounter    ED Discharge Orders         Ordered    cephALEXin (KEFLEX) 500 MG capsule  4 times daily     03/28/19 2226    HYDROcodone-acetaminophen (NORCO/VICODIN) 5-325 MG tablet  Every 6 hours PRN     03/28/19 2226           Lorin Glass, Hershal Coria 03/28/19 2241    Margette Fast, MD 03/28/19 2252

## 2020-01-10 ENCOUNTER — Other Ambulatory Visit: Payer: Self-pay

## 2020-01-10 ENCOUNTER — Emergency Department (HOSPITAL_BASED_OUTPATIENT_CLINIC_OR_DEPARTMENT_OTHER)
Admission: EM | Admit: 2020-01-10 | Discharge: 2020-01-10 | Discharge status: Against Medical Advice | Payer: Medicare Other | Attending: Emergency Medicine | Admitting: Emergency Medicine

## 2020-01-10 ENCOUNTER — Encounter (HOSPITAL_BASED_OUTPATIENT_CLINIC_OR_DEPARTMENT_OTHER): Payer: Self-pay | Admitting: Emergency Medicine

## 2020-01-10 DIAGNOSIS — R531 Weakness: Secondary | ICD-10-CM

## 2020-01-10 DIAGNOSIS — M6281 Muscle weakness (generalized): Secondary | ICD-10-CM | POA: Diagnosis present

## 2020-01-10 DIAGNOSIS — Z881 Allergy status to other antibiotic agents status: Secondary | ICD-10-CM | POA: Diagnosis not present

## 2020-01-10 DIAGNOSIS — D509 Iron deficiency anemia, unspecified: Secondary | ICD-10-CM | POA: Insufficient documentation

## 2020-01-10 LAB — BASIC METABOLIC PANEL
Anion gap: 9 (ref 5–15)
BUN: 14 mg/dL (ref 6–20)
CO2: 23 mmol/L (ref 22–32)
Calcium: 8.7 mg/dL — ABNORMAL LOW (ref 8.9–10.3)
Chloride: 106 mmol/L (ref 98–111)
Creatinine, Ser: 0.68 mg/dL (ref 0.44–1.00)
GFR calc Af Amer: >60 mL/min (ref >60)
GFR calc non Af Amer: >60 mL/min (ref >60)
Glucose, Bld: 96 mg/dL (ref 70–99)
Potassium: 3.5 mmol/L (ref 3.5–5.1)
Sodium: 138 mmol/L (ref 135–145)

## 2020-01-10 LAB — URINALYSIS, ROUTINE W REFLEX MICROSCOPIC
Bilirubin Urine: NEGATIVE
Glucose, UA: NEGATIVE mg/dL
Hgb urine dipstick: NEGATIVE
Ketones, ur: NEGATIVE mg/dL
Leukocytes,Ua: NEGATIVE
Nitrite: NEGATIVE
Protein, ur: NEGATIVE mg/dL
Specific Gravity, Urine: 1.025 (ref 1.005–1.030)
pH: 5.5 (ref 5.0–8.0)

## 2020-01-10 LAB — CBC WITH DIFFERENTIAL/PLATELET
Abs Immature Granulocytes: 0.01 10*3/uL (ref 0.00–0.07)
Basophils Absolute: 0 10*3/uL (ref 0.0–0.1)
Basophils Relative: 1 %
Eosinophils Absolute: 0.2 10*3/uL (ref 0.0–0.5)
Eosinophils Relative: 4 %
HCT: 23.8 % — ABNORMAL LOW (ref 36.0–46.0)
Hemoglobin: 6.7 g/dL — CL (ref 12.0–15.0)
Immature Granulocytes: 0 %
Lymphocytes Relative: 30 %
Lymphs Abs: 1.3 10*3/uL (ref 0.7–4.0)
MCH: 16.4 pg — ABNORMAL LOW (ref 26.0–34.0)
MCHC: 28.2 g/dL — ABNORMAL LOW (ref 30.0–36.0)
MCV: 58.3 fL — ABNORMAL LOW (ref 80.0–100.0)
Monocytes Absolute: 0.4 10*3/uL (ref 0.1–1.0)
Monocytes Relative: 9 %
Neutro Abs: 2.4 10*3/uL (ref 1.7–7.7)
Neutrophils Relative %: 56 %
Platelets: 353 10*3/uL (ref 150–400)
RBC: 4.08 MIL/uL (ref 3.87–5.11)
RDW: 20.3 % — ABNORMAL HIGH (ref 11.5–15.5)
WBC: 4.2 10*3/uL (ref 4.0–10.5)
nRBC: 0 % (ref 0.0–0.2)

## 2020-01-10 LAB — PREGNANCY, URINE: Preg Test, Ur: NEGATIVE

## 2020-01-10 NOTE — ED Notes (Signed)
Occult Blood Stool orders rec, card obtained, pt refuses stool sample, also refuses to be admitted, states "I want a referral for Iron Infusions", Attending EDP is aware as well as PA in room

## 2020-01-10 NOTE — ED Notes (Signed)
HBG 6.7, ED provider informed

## 2020-01-10 NOTE — ED Notes (Signed)
Tolerated Orthostatic VS well, voiced no complaints of light headiness, dizziness, weakness etc, remained in NSR on cardiac monitor

## 2020-01-10 NOTE — ED Triage Notes (Signed)
Generalized weakness x 1 week. Requesting to have hgb checked

## 2020-01-10 NOTE — ED Provider Notes (Signed)
[facility] EMERGENCY DEPARTMENT Provider Note   CSN: [id number] Arrival date & time: 01/10/20  1324     History Chief Complaint  Patient presents with  . Weakness    Kimberly Grimes is a 49 y.o. female with a past medical history significant for iron deficiency anemia, history of blood transfusions, RA, panic attacks, and sickle cell trait who presents to the ED due to generalized weakness for the past week.  Patient denies unilateral weakness, speech changes, and facial droop.  She notes she has been feeling extremely weak and lightheaded that is progressively worsened over the past week.  She notes it feels similar to when her hemoglobin has been low in the past.  Denies chest pain and shortness of breath.  Denies melena, hematochezia, and hematemesis.  Her last menstrual cycle was 2 weeks ago and notes it was heavy for a few days.  Patient has a history of menorrhagia with large blood clots.  Patient denies nausea, vomiting, diarrhea, and abdominal pain.  No sick contacts or Covid exposures.  Denies headaches.  She has been compliant with her iron supplements.  No aggravating or alleviating factors.  No treatment prior to arrival.  History obtained from patient and past medical records. No interpreter used during encounter.      Past Medical History:  Diagnosis Date  . Anemia 12/11/2011  . Arthritis    "all over" (01/14/2014)  . Heart murmur   . History of blood clots    Blood clots during menstrual cycle  . History of blood transfusion 01/13/2014   "related to menses"  . Iron deficiency anemia   . Muscle spasms of lower extremity   . Panic attack   . Rheumatoid arthritis(714.0)   . Sickle cell trait ([place])   . Varicose veins     Patient Active Problem List   Diagnosis Date Noted  . History of rheumatoid arthritis 01/14/2014  . Plantar fasciitis, bilateral 01/14/2014  . H/O: menorrhagia 01/14/2014  . Weakness 01/14/2014  . Trichomoniasis 01/14/2014  .  Microcytic anemia 01/14/2014  . Arthralgia 01/13/2014  . Excessive or frequent menstruation 12/18/2011    Past Surgical History:  Procedure Laterality Date  . TUBAL LIGATION  2007     OB History    Gravida  4   Para      Term      Preterm      AB      Living  5     SAB      TAB      Ectopic      Multiple  1   Live Births              Family History  Problem Relation Age of Onset  . Rheum arthritis Mother     Social History   Tobacco Use  . Smoking status: Never Smoker  . Smokeless tobacco: Never Used  Substance Use Topics  . Alcohol use: Yes    Comment: 01/14/2014 "might have a drink or 2 couple times/yr, if that"  . Drug use: No    Home Medications Prior to Admission medications   Medication Sig Start Date End Date Taking? Authorizing Provider  predniSONE (DELTASONE) 10 MG tablet Take 1-2 tabs for 3-5 days then discontinue as needed for flares for Rheumatoid Arthritis 02/25/19  Yes [provider]  albuterol (PROVENTIL HFA;VENTOLIN HFA) 108 (90 BASE) MCG/ACT inhaler Inhale 2 puffs into the lungs every 6 (six) hours as needed. For shortness of breath  [provider]  ferrous sulfate 325 (65 FE) MG tablet Take 1 tablet (325 mg total) by mouth daily with breakfast. 04/27/14   Wyatt Portela, MD  HYDROcodone-acetaminophen (NORCO/VICODIN) 5-325 MG tablet Take 1 tablet by mouth every 6 (six) hours as needed for severe pain. 03/28/19   Lorin Glass, PA-C  ibuprofen (ADVIL,MOTRIN) 200 MG tablet Take 200 mg by mouth every 6 (six) hours as needed for moderate pain.    [provider]  naproxen (NAPROSYN) 500 MG tablet Take 500 mg by mouth 2 (two) times daily as needed for moderate pain.    [provider]  naproxen (NAPROSYN) 500 MG tablet Take 1 tablet (500 mg total) by mouth 2 (two) times daily as needed for mild pain, moderate pain or headache (TAKE WITH MEALS.). 11/06/14   Street, [place], PA-C    Allergies      Decadron [dexamethasone] and Tylenol [acetaminophen]  Review of Systems   Review of Systems  Constitutional: Negative for chills and fever.  Respiratory: Negative for shortness of breath.   Cardiovascular: Negative for chest pain.  Gastrointestinal: Negative for abdominal pain, anal bleeding, blood in stool, diarrhea, nausea and vomiting.  Genitourinary: Negative for dysuria.  Neurological: Positive for weakness and light-headedness. Negative for syncope, facial asymmetry, speech difficulty and numbness. Tremors: generalized.  All other systems reviewed and are negative.   Physical Exam Updated Vital Signs BP (!) 149/82   Pulse 63   Temp 98.3 F (36.8 C) (Oral)   Resp 16   Ht 5\' 4"  (1.626 m)   Wt 81.6 kg   LMP 12/28/2019   SpO2 100%   BMI 30.90 kg/m   Physical Exam Vitals and nursing note reviewed.  Constitutional:      General: She is not in acute distress.    Appearance: She is not toxic-appearing.     Comments: Pale in appearance.   HENT:     Head: Normocephalic.  Eyes:     Pupils: Pupils are equal, round, and reactive to light.  Cardiovascular:     Rate and Rhythm: Normal rate and regular rhythm.     Pulses: Normal pulses.     Heart sounds: Normal heart sounds. No murmur heard.  No friction rub. No gallop.   Pulmonary:     Effort: Pulmonary effort is normal.     Breath sounds: Normal breath sounds.  Abdominal:     General: Abdomen is flat. There is no distension.     Palpations: Abdomen is soft.     Tenderness: There is no abdominal tenderness. There is no guarding or rebound.  Musculoskeletal:     Cervical back: Neck supple.     Comments: Able to move all 4 extremities without difficulty.   Skin:    Coloration: Skin is pale.  Neurological:     General: No focal deficit present.     Mental Status: She is alert.     Comments: Speech is clear, able to follow commands CN III-XII intact Normal strength in upper and lower extremities bilaterally including  dorsiflexion and plantar flexion, strong and equal grip strength Sensation grossly intact throughout Moves extremities without ataxia, coordination intact  Psychiatric:        Mood and Affect: Mood normal.        Behavior: Behavior normal.     ED Results / Procedures / Treatments   Labs (all labs ordered are listed, but only abnormal results are displayed) Labs Reviewed  CBC WITH DIFFERENTIAL/PLATELET - Abnormal; Notable  for the following components:      Result Value   Hemoglobin 6.7 (*)    HCT 23.8 (*)    MCV 58.3 (*)    MCH 16.4 (*)    MCHC 28.2 (*)    RDW 20.3 (*)    All other components within normal limits  BASIC METABOLIC PANEL - Abnormal; Notable for the following components:   Calcium 8.7 (*)    All other components within normal limits  URINALYSIS, ROUTINE W REFLEX MICROSCOPIC  PREGNANCY, URINE  POC OCCULT BLOOD, ED    EKG EKG Interpretation  Date/Time:  Sunday January 10 2020 14:15:41 EDT Ventricular Rate:  65 PR Interval:    QRS Duration: 92 QT Interval:  425 QTC Calculation: 442 R Axis:   43 Text Interpretation: Sinus rhythm Confirmed by Adam, [place] ([id number]) on 01/10/2020 2:30:26 PM   Radiology No results found.  Procedures Procedures (including critical care time)  Medications Ordered in ED Medications - No data to display  ED Course  I have reviewed the triage vital signs and the nursing notes.  Pertinent labs & imaging results that were available during my care of the patient were reviewed by me and considered in my medical decision making (see chart for details).  Clinical Course as of Jan 09 1626  Sun Jan 10, 2020  1548 Hemoglobin(!!): 6.7 [CA]    Clinical Course User Index [CA] Tynell Winchell C, PA-C   MDM Rules/Calculators/A&P                         49  year old female presents to the ED due to generalized weakness for the past week.  Patient has a history of iron deficiency anemia and history of past blood transfusions.  She  notes this feels similar to the past when her hemoglobin has been low.  Denies melena, hematochezia, and hematemesis.  She last had a menstrual cycle 2 weeks ago.  Denies chest pain and shortness of breath.  Denies fever and chills.  Upon arrival, patient is afebrile, not tachycardic or hypoxic.  Patient in no acute distress and nontoxic-appearing.  Patient appears pale.  Physical exam reassuring.  Neurological exam nonfocal.  Low suspicion for CVA.  Obtain BMP and CBC to rule out symptomatic anemia and electrolyte abnormalities.  Will also obtain EKG.  EKG personally reviewed which demonstrates normal sinus rhythm with no signs of acute ischemia.  ED reassuring with normal renal function no major electrolyte derangements.  UA unremarkable with no signs of infection.  Pregnancy test negative.  CBC significant for severe anemia with hemoglobin at 6.7.  Patient deferred rectal exam.  4:06 PM discussed results with patient at length in regards to her hemoglobin and need for admission for blood transfusions however, patient states she does not want to be admitted to the hospital and would prefer a referral for iron infusions.  Patient notes past blood transfusions have not helped with her symptoms and she would prefer a referral to the cancer center for an iron transfusion. Patient used to be a patient 5 years ago prior to moving to [place] and would like a new referral. I discussed at length the dangers of low hemoglobin. I have discussed my concerns as his provider and the possibility that this may worsen. I have specifically discussed that without further evaluation I cannot guarantee there is not a life threatening event occuring.  Pt is A&Ox4, his own POA and states understanding of my concerns and the possible  consequences.  I have made pt aware that this is an [place] discharge, but he may return at any time for further evaluation and treatment.  Discussed case with Dr. Johnney Killian who agrees with assessment and  plan.   Final Clinical Impression(s) / ED Diagnoses Final diagnoses:  Generalized weakness  Iron deficiency anemia, unspecified iron deficiency anemia type    Rx / DC Orders ED Discharge Orders         Ordered    Ambulatory referral to Oncology     Discontinue  Reprint    Comments: HGB 6.7 and patient refused hospital admission and would prefer iron infusions which have helped in the past   01/10/20 1623           Karie Kirks 01/10/20 1629    Charlesetta Shanks, MD 01/10/20 1819

## 2020-01-10 NOTE — Discharge Instructions (Addendum)
As discussed, you are leaving against medical advice; however, you are welcome to come back at any time if your symptoms worsen. I have placed a referral to oncology for further evaluation. You should receive a phone call in the next few days to schedule an appointment. If you do not hear from them, please call to schedule an appointment. Return to the ER for worsening symptoms, shortness of breath, chest pain, or any concerning symptoms.

## 2020-01-11 ENCOUNTER — Telehealth: Payer: Self-pay | Admitting: Oncology

## 2020-01-11 NOTE — Telephone Encounter (Signed)
Received an urgent referral from the hospital for hgb 6.7. Pt has been cld and scheduled to see Dr. Alen Blew on 7/1 at 11am. Pt aware to arrive 15 minutes early.

## 2020-01-14 ENCOUNTER — Other Ambulatory Visit: Payer: Self-pay

## 2020-01-14 ENCOUNTER — Inpatient Hospital Stay: Payer: Medicare Other | Attending: Oncology | Admitting: Oncology

## 2020-01-14 VITALS — BP 144/92 | HR 72 | Temp 99.0°F | Resp 18 | Ht 64.0 in | Wt 187.4 lb

## 2020-01-14 DIAGNOSIS — D509 Iron deficiency anemia, unspecified: Secondary | ICD-10-CM

## 2020-01-14 NOTE — Progress Notes (Signed)
Hematology and Oncology Follow Up Visit  Kimberly Grimes [id number] 1970/07/22 49 y.o. 01/14/2020 10:42 AM Blount, Denton Meek, MD (Inactive)[place], [place], [place]*   Principle Diagnosis: 49 year old women with anemia related to chronic menstrual blood losses and iron deficiency diagnosed in 2013.   Prior Therapy: IV iron infusion on multiple occasions started in 2013.  Last IV iron infusion was given in August 2020 while living in [place].  Current therapy: Under evaluation for repeat IV iron.  Interim History: Ms Hardiman returns today for a follow-up visit.  She is a 49 year old woman with history of iron deficiency anemia multiple IV iron infusion treatment in the past.  She was last seen here in May 2015 and relocated to [place].  She did receive intravenous iron in August 2020.  She returned to this area and presented to the emergency department on 01/10/2020 with complaint of dizziness and weakness.  CBC obtained at the time and showed a hemoglobin of 6.7 and MCV of 58.3.  White cell count and platelet count were normal.  He was offered packed red cell transfusion and deferred at that time.  Clinically, she reports feeling fatigued but no chest pain or shortness of breath.  He denies any dyspnea on exertion.  He continues to attend to activities of daily living without any decline.  She denies hematochezia, melena or hemoptysis.  She does report regular menstrual cycles and becoming less heavy.  Medications: Reviewed without changes.     Current Outpatient Medications  Medication Sig Dispense Refill  . albuterol (PROVENTIL HFA;VENTOLIN HFA) 108 (90 BASE) MCG/ACT inhaler Inhale 2 puffs into the lungs every 6 (six) hours as needed. For shortness of breath    . ferrous sulfate 325 (65 FE) MG tablet Take 1 tablet (325 mg total) by mouth daily with breakfast. 90 tablet 3  . HYDROcodone-acetaminophen (NORCO/VICODIN) 5-325 MG tablet Take 1 tablet by mouth every 6 (six) hours as needed  for severe pain. 3 tablet 0  . ibuprofen (ADVIL,MOTRIN) 200 MG tablet Take 200 mg by mouth every 6 (six) hours as needed for moderate pain.    . naproxen (NAPROSYN) 500 MG tablet Take 500 mg by mouth 2 (two) times daily as needed for moderate pain.    . naproxen (NAPROSYN) 500 MG tablet Take 1 tablet (500 mg total) by mouth 2 (two) times daily as needed for mild pain, moderate pain or headache (TAKE WITH MEALS.). 20 tablet 0  . predniSONE (DELTASONE) 10 MG tablet Take 1-2 tabs for 3-5 days then discontinue as needed for flares for Rheumatoid Arthritis     No current facility-administered medications for this visit.    Allergies:  Allergies  Allergen Reactions  . Decadron [Dexamethasone] Other (See Comments)    Vaginal itching and burning reaction.  . Tylenol [Acetaminophen] Other (See Comments)    Upset stomach      .  Physical Exam: Blood pressure (!) 144/92, pulse 72, temperature 99 F (37.2 C), temperature source Oral, resp. rate 18, height 5\' 4"  (1.626 m), weight 187 lb 6.4 oz (85 kg), last menstrual period 12/28/2019, SpO2 100 %.  ECOG: 0    General appearance: Comfortable appearing without any discomfort Head: Normocephalic without any trauma Oropharynx: Mucous membranes are moist and pink without any thrush or ulcers. Eyes: Pupils are equal and round reactive to light. Lymph nodes: No cervical, supraclavicular, inguinal or axillary lymphadenopathy.   Heart:regular rate and rhythm.  S1 and S2 without leg edema. Lung: Clear without any rhonchi or wheezes.  No dullness to percussion. Abdomin: Soft, nontender, nondistended with good bowel sounds.  No hepatosplenomegaly. Musculoskeletal: No joint deformity or effusion.  Full range of motion noted. Neurological: No deficits noted on motor, sensory and deep tendon reflex exam. Skin: No petechial rash or dryness.  Appeared moist.      Lab Results: Lab Results  Component Value Date   WBC 4.2 01/10/2020   HGB 6.7 (LL)  01/10/2020   HCT 23.8 (L) 01/10/2020   MCV 58.3 (L) 01/10/2020   PLT 353 01/10/2020     Chemistry      Component Value Date/Time   NA 138 01/10/2020 1447   NA 140 04/27/2014 0931   K 3.5 01/10/2020 1447   K 4.1 04/27/2014 0931   CL 106 01/10/2020 1447   CO2 23 01/10/2020 1447   CO2 25 04/27/2014 0931   BUN 14 01/10/2020 1447   BUN 13.1 04/27/2014 0931   CREATININE 0.68 01/10/2020 1447   CREATININE 0.7 04/27/2014 0931      Component Value Date/Time   CALCIUM 8.7 (L) 01/10/2020 1447   CALCIUM 9.3 04/27/2014 0931   ALKPHOS 82 04/27/2014 0931   AST 10 04/27/2014 0931   ALT 11 04/27/2014 0931   BILITOT <0.20 04/27/2014 0931      Impression and Plan:   50 year old woman with:  1.  Iron deficiency anemia related to chronic menstrual blood losses dating back to at least 2013.  She has received intravenous iron in the past multiple occasions.  She has relocated to this area and here to establish care.  Laboratory data indicate severe anemia with microcytosis and elevated RDW.  This is likely exacerbation of her iron deficiency and she would benefit from a repeat intravenous iron.  I do not think that she needs packed red cell transfusion at this time given her presentation and symptoms and the chronicity of her anemia.  I recommended proceeding with IV iron infusion we will arrange for her to take place in the near future.  Risks and benefits of Feraheme infusion were reviewed.  Complications including arthralgias, myalgias and rarely anaphylaxis were reiterated.  We will update her iron studies prior to her infusion.  She has been on oral iron replacement although it has not been successful in maintaining her iron levels despite oral iron replacement she continues to have severe iron deficiency.  2.  Follow-up: Will be in the near future to follow her counts closely and repeat IV iron as needed.   30  minutes were dedicated to this visit. The time was spent on reviewing  laboratory data,  discussing treatment options, discussing differential diagnosis and answering questions regarding future plan.     Zola Button 7/1/202110:42 AM

## 2020-01-19 ENCOUNTER — Inpatient Hospital Stay: Payer: Medicare Other

## 2020-01-19 ENCOUNTER — Telehealth: Payer: Self-pay | Admitting: Oncology

## 2020-01-19 NOTE — Telephone Encounter (Signed)
Scheduled apt per last LOS - lab and iron - pt called in for appts and she is aware of 7/7 and 7/15

## 2020-01-20 ENCOUNTER — Inpatient Hospital Stay: Payer: Medicare Other

## 2020-01-20 ENCOUNTER — Telehealth: Payer: Self-pay

## 2020-01-20 ENCOUNTER — Other Ambulatory Visit: Payer: Self-pay

## 2020-01-20 VITALS — BP 128/74 | HR 67 | Temp 99.1°F | Resp 18

## 2020-01-20 DIAGNOSIS — D509 Iron deficiency anemia, unspecified: Secondary | ICD-10-CM

## 2020-01-20 LAB — CBC WITH DIFFERENTIAL (CANCER CENTER ONLY)
Abs Immature Granulocytes: 0 10*3/uL (ref 0.00–0.07)
Basophils Absolute: 0 10*3/uL (ref 0.0–0.1)
Basophils Relative: 1 %
Eosinophils Absolute: 0.2 10*3/uL (ref 0.0–0.5)
Eosinophils Relative: 4 %
HCT: 23.8 % — ABNORMAL LOW (ref 36.0–46.0)
Hemoglobin: 6.8 g/dL — CL (ref 12.0–15.0)
Immature Granulocytes: 0 %
Lymphocytes Relative: 29 %
Lymphs Abs: 1.3 10*3/uL (ref 0.7–4.0)
MCH: 16.3 pg — ABNORMAL LOW (ref 26.0–34.0)
MCHC: 28.6 g/dL — ABNORMAL LOW (ref 30.0–36.0)
MCV: 56.9 fL — ABNORMAL LOW (ref 80.0–100.0)
Monocytes Absolute: 0.5 10*3/uL (ref 0.1–1.0)
Monocytes Relative: 10 %
Neutro Abs: 2.6 10*3/uL (ref 1.7–7.7)
Neutrophils Relative %: 56 %
Platelet Count: 355 10*3/uL (ref 150–400)
RBC: 4.18 MIL/uL (ref 3.87–5.11)
RDW: 20.4 % — ABNORMAL HIGH (ref 11.5–15.5)
WBC Count: 4.6 10*3/uL (ref 4.0–10.5)
nRBC: 0 % (ref 0.0–0.2)

## 2020-01-20 LAB — IRON AND TIBC
Iron: 15 ug/dL — ABNORMAL LOW (ref 41–142)
Saturation Ratios: 3 % — ABNORMAL LOW (ref 21–57)
TIBC: 420 ug/dL (ref 236–444)
UIBC: 405 ug/dL — ABNORMAL HIGH (ref 120–384)

## 2020-01-20 LAB — FERRITIN: Ferritin: <4 ng/mL — ABNORMAL LOW (ref 11–307)

## 2020-01-20 MED ORDER — SODIUM CHLORIDE 0.9 % IV SOLN
Freq: Once | INTRAVENOUS | Status: AC
  Filled 2020-01-20: qty 250

## 2020-01-20 MED ORDER — SODIUM CHLORIDE 0.9 % IV SOLN
510.0000 mg | Freq: Once | INTRAVENOUS | Status: AC
  Administered 2020-01-20: 510 mg via INTRAVENOUS
  Filled 2020-01-20: qty 17

## 2020-01-20 NOTE — Patient Instructions (Signed)
Anemia  Anemia is a condition in which you do not have enough red blood cells or hemoglobin. Hemoglobin is a substance in red blood cells that carries oxygen. When you do not have enough red blood cells or hemoglobin (are anemic), your body cannot get enough oxygen and your organs may not work properly. As a result, you may feel very tired or have other problems. What are the causes? Common causes of anemia include: Excessive bleeding. Anemia can be caused by excessive bleeding inside or outside the body, including bleeding from the intestine or from periods in women. Poor nutrition. Long-lasting (chronic) kidney, thyroid, and liver disease. Bone marrow disorders. Cancer and treatments for cancer. HIV (human immunodeficiency virus) and AIDS (acquired immunodeficiency syndrome). Treatments for HIV and AIDS. Spleen problems. Blood disorders. Infections, medicines, and autoimmune disorders that destroy red blood cells. What are the signs or symptoms? Symptoms of this condition include: Minor weakness. Dizziness. Headache. Feeling heartbeats that are irregular or faster than normal (palpitations). Shortness of breath, especially with exercise. Paleness. Cold sensitivity. Indigestion. Nausea. Difficulty sleeping. Difficulty concentrating. Symptoms may occur suddenly or develop slowly. If your anemia is mild, you may not have symptoms. How is this diagnosed? This condition is diagnosed based on: Blood tests. Your medical history. A physical exam. Bone marrow biopsy. Your health care provider may also check your stool (feces) for blood and may do additional testing to look for the cause of your bleeding. You may also have other tests, including: Imaging tests, such as a CT scan or MRI. Endoscopy. Colonoscopy. How is this treated? Treatment for this condition depends on the cause. If you continue to lose a lot of blood, you may need to be treated at a hospital. Treatment may  include: Taking supplements of iron, vitamin V89, or folic acid. Taking a hormone medicine (erythropoietin) that can help to stimulate red blood cell growth. Having a blood transfusion. This may be needed if you lose a lot of blood. Making changes to your diet. Having surgery to remove your spleen. Follow these instructions at home: Take over-the-counter and prescription medicines only as told by your health care provider. Take supplements only as told by your health care provider. Follow any diet instructions that you were given. Keep all follow-up visits as told by your health care provider. This is important. Contact a health care provider if: You develop new bleeding anywhere in the body. Get help right away if: You are very weak. You are short of breath. You have pain in your abdomen or chest. You are dizzy or feel faint. You have trouble concentrating. You have bloody or black, tarry stools. You vomit repeatedly or you vomit up blood. Summary Anemia is a condition in which you do not have enough red blood cells or enough of a substance in your red blood cells that carries oxygen (hemoglobin). Symptoms may occur suddenly or develop slowly. If your anemia is mild, you may not have symptoms. This condition is diagnosed with blood tests as well as a medical history and physical exam. Other tests may be needed. Treatment for this condition depends on the cause of the anemia. This information is not intended to replace advice given to you by your health care provider. Make sure you discuss any questions you have with your health care provider. Document Revised: 06/14/2017 Document Reviewed: 08/03/2016 Elsevier Patient Education  [place]. Ferumoxytol injection What is this medicine? FERUMOXYTOL is an iron complex. Iron is used to make healthy red blood cells,  which carry oxygen and nutrients throughout the body. This medicine is used to treat iron deficiency anemia. This  medicine may be used for other purposes; ask your health care provider or pharmacist if you have questions. COMMON BRAND NAME(S): Feraheme What should I tell my health care provider before I take this medicine? They need to know if you have any of these conditions:  anemia not caused by low iron levels  high levels of iron in the blood  magnetic resonance imaging (MRI) test scheduled  an unusual or allergic reaction to iron, other medicines, foods, dyes, or preservatives  pregnant or trying to get pregnant  breast-feeding How should I use this medicine? This medicine is for injection into a vein. It is given by a health care professional in a hospital or clinic setting. Talk to your pediatrician regarding the use of this medicine in children. Special care may be needed. Overdosage: If you think you have taken too much of this medicine contact a poison control center or emergency room at once. NOTE: This medicine is only for you. Do not share this medicine with others. What if I miss a dose? It is important not to miss your dose. Call your doctor or health care professional if you are unable to keep an appointment. What may interact with this medicine? This medicine may interact with the following medications:  other iron products This list may not describe all possible interactions. Give your health care provider a list of all the medicines, herbs, non-prescription drugs, or dietary supplements you use. Also tell them if you smoke, drink alcohol, or use illegal drugs. Some items may interact with your medicine. What should I watch for while using this medicine? Visit your doctor or healthcare professional regularly. Tell your doctor or healthcare professional if your symptoms do not start to get better or if they get worse. You may need blood work done while you are taking this medicine. You may need to follow a special diet. Talk to your doctor. Foods that contain iron include: whole  grains/cereals, dried fruits, beans, or peas, leafy green vegetables, and organ meats (liver, kidney). What side effects may I notice from receiving this medicine? Side effects that you should report to your doctor or health care professional as soon as possible:  allergic reactions like skin rash, itching or hives, swelling of the face, lips, or tongue  breathing problems  changes in blood pressure  feeling faint or lightheaded, falls  fever or chills  flushing, sweating, or hot feelings  swelling of the ankles or feet Side effects that usually do not require medical attention (report to your doctor or health care professional if they continue or are bothersome):  diarrhea  headache  nausea, vomiting  stomach pain This list may not describe all possible side effects. Call your doctor for medical advice about side effects. You may report side effects to FDA at 1-800-FDA-1088. Where should I keep my medicine? This drug is given in a hospital or clinic and will not be stored at home. NOTE: This sheet is a summary. It may not cover all possible information. If you have questions about this medicine, talk to your doctor, pharmacist, or health care provider.  2020 Elsevier/Gold Standard (2016-08-20 20:21:10)

## 2020-01-20 NOTE — Progress Notes (Signed)
Pt. declined blood transfusion today. Pt. denies complaints of weakness, chest pain, dizziness, and no respiratory distress noted.

## 2020-01-20 NOTE — Telephone Encounter (Signed)
CRITICAL VALUE STICKER  CRITICAL VALUE: Hemoglobin 6.8  RECEIVER (on-site recipient of call): Wendall Mola, RN  DATE & TIME NOTIFIED: 01/20/2020 @1355   MESSENGER (representative from lab): Rolland Porter  MD NOTIFIED:  Ledell Peoples  TIME OF NOTIFICATION: 2248  RESPONSE: Patient is scheduled for Feraheme infusion today. Patient offered blood transfusion which she declined. Per Dr. Lorenso Courier patient made aware of risks of not receiving blood transfusion and low hemoglobin levels. Patient verbalized understanding.

## 2020-01-28 ENCOUNTER — Other Ambulatory Visit: Payer: Self-pay

## 2020-01-28 ENCOUNTER — Telehealth: Payer: Self-pay | Admitting: Oncology

## 2020-01-28 ENCOUNTER — Inpatient Hospital Stay: Payer: Medicare Other

## 2020-01-28 VITALS — BP 127/80 | HR 68 | Temp 98.3°F | Resp 18

## 2020-01-28 DIAGNOSIS — D509 Iron deficiency anemia, unspecified: Secondary | ICD-10-CM | POA: Diagnosis not present

## 2020-01-28 MED ORDER — SODIUM CHLORIDE 0.9 % IV SOLN
510.0000 mg | Freq: Once | INTRAVENOUS | Status: AC
  Administered 2020-01-28: 510 mg via INTRAVENOUS
  Filled 2020-01-28: qty 510

## 2020-01-28 MED ORDER — SODIUM CHLORIDE 0.9 % IV SOLN
Freq: Once | INTRAVENOUS | Status: AC
  Filled 2020-01-28: qty 250

## 2020-01-28 NOTE — Progress Notes (Signed)
Pt declined 30 min observation. VSS at discharge. 

## 2020-01-28 NOTE — Telephone Encounter (Signed)
Scheduled per 07/01 los, patient has been called and notified.

## 2020-02-02 NOTE — Telephone Encounter (Signed)
Please disregard

## 2020-04-14 ENCOUNTER — Inpatient Hospital Stay: Payer: Medicare Other | Attending: Oncology

## 2020-04-14 ENCOUNTER — Inpatient Hospital Stay (HOSPITAL_BASED_OUTPATIENT_CLINIC_OR_DEPARTMENT_OTHER): Payer: Medicare Other | Admitting: Oncology

## 2020-04-14 ENCOUNTER — Other Ambulatory Visit: Payer: Self-pay

## 2020-04-14 VITALS — BP 142/78 | HR 65 | Temp 97.1°F | Resp 18 | Ht 64.0 in | Wt 189.2 lb

## 2020-04-14 DIAGNOSIS — D509 Iron deficiency anemia, unspecified: Secondary | ICD-10-CM | POA: Diagnosis not present

## 2020-04-14 DIAGNOSIS — K909 Intestinal malabsorption, unspecified: Secondary | ICD-10-CM | POA: Insufficient documentation

## 2020-04-14 DIAGNOSIS — D500 Iron deficiency anemia secondary to blood loss (chronic): Secondary | ICD-10-CM | POA: Diagnosis not present

## 2020-04-14 DIAGNOSIS — Z79899 Other long term (current) drug therapy: Secondary | ICD-10-CM | POA: Diagnosis not present

## 2020-04-14 DIAGNOSIS — N920 Excessive and frequent menstruation with regular cycle: Secondary | ICD-10-CM | POA: Diagnosis not present

## 2020-04-14 DIAGNOSIS — Z7952 Long term (current) use of systemic steroids: Secondary | ICD-10-CM | POA: Insufficient documentation

## 2020-04-14 DIAGNOSIS — Z791 Long term (current) use of non-steroidal anti-inflammatories (NSAID): Secondary | ICD-10-CM | POA: Diagnosis not present

## 2020-04-14 LAB — CBC WITH DIFFERENTIAL (CANCER CENTER ONLY)
Abs Immature Granulocytes: 0.01 10*3/uL (ref 0.00–0.07)
Basophils Absolute: 0 10*3/uL (ref 0.0–0.1)
Basophils Relative: 0 %
Eosinophils Absolute: 0.1 10*3/uL (ref 0.0–0.5)
Eosinophils Relative: 2 %
HCT: 28.9 % — ABNORMAL LOW (ref 36.0–46.0)
Hemoglobin: 9.1 g/dL — ABNORMAL LOW (ref 12.0–15.0)
Immature Granulocytes: 0 %
Lymphocytes Relative: 24 %
Lymphs Abs: 1.2 10*3/uL (ref 0.7–4.0)
MCH: 22.5 pg — ABNORMAL LOW (ref 26.0–34.0)
MCHC: 31.5 g/dL (ref 30.0–36.0)
MCV: 71.4 fL — ABNORMAL LOW (ref 80.0–100.0)
Monocytes Absolute: 0.3 10*3/uL (ref 0.1–1.0)
Monocytes Relative: 7 %
Neutro Abs: 3.1 10*3/uL (ref 1.7–7.7)
Neutrophils Relative %: 67 %
Platelet Count: 295 10*3/uL (ref 150–400)
RBC: 4.05 MIL/uL (ref 3.87–5.11)
RDW: 18.6 % — ABNORMAL HIGH (ref 11.5–15.5)
WBC Count: 4.7 10*3/uL (ref 4.0–10.5)
nRBC: 0 % (ref 0.0–0.2)

## 2020-04-14 LAB — IRON AND TIBC
Iron: 15 ug/dL — ABNORMAL LOW (ref 41–142)
Saturation Ratios: 4 % — ABNORMAL LOW (ref 21–57)
TIBC: 398 ug/dL (ref 236–444)
UIBC: 383 ug/dL (ref 120–384)

## 2020-04-14 LAB — FERRITIN: Ferritin: 4 ng/mL — ABNORMAL LOW (ref 11–307)

## 2020-04-14 NOTE — Progress Notes (Signed)
Hematology and Oncology Follow Up Visit  Kimberly Grimes [id number] May 19, 1971 49 y.o. 04/14/2020 9:42 AM Patient, No Pcp Kimberly Klinefelter, PA*   Principle Diagnosis: 49 year old women with iron deficiency anemia diagnosed in 2013.  This was related to chronic menstrual blood losses.     Prior Therapy: IV iron infusion on multiple occasions started in 2013.    Current therapy: IV iron infusion intermittently with the last treatment given in July 2021.  Interim History: Kimberly Grimes returns today for a follow-up visit.  Since last visit, she received intravenous iron on 2 separate occasions completed on January 28, 2020.  Since that last infusion, she reported improvement in her overall and performance status.  He denies any chest pain, shortness of breath or difficulty breathing.  She denies any hematochezia or melena.  She does have regular menstrual cycles but have been heavy last month.   Medications: Updated on review.     Current Outpatient Medications  Medication Sig Dispense Refill  . albuterol (PROVENTIL HFA;VENTOLIN HFA) 108 (90 BASE) MCG/ACT inhaler Inhale 2 puffs into the lungs every 6 (six) hours as needed. For shortness of breath    . ferrous sulfate 325 (65 FE) MG tablet Take 1 tablet (325 mg total) by mouth daily with breakfast. 90 tablet 3  . HYDROcodone-acetaminophen (NORCO/VICODIN) 5-325 MG tablet Take 1 tablet by mouth every 6 (six) hours as needed for severe pain. 3 tablet 0  . ibuprofen (ADVIL,MOTRIN) 200 MG tablet Take 200 mg by mouth every 6 (six) hours as needed for moderate pain.    . naproxen (NAPROSYN) 500 MG tablet Take 500 mg by mouth 2 (two) times daily as needed for moderate pain.    . naproxen (NAPROSYN) 500 MG tablet Take 1 tablet (500 mg total) by mouth 2 (two) times daily as needed for mild pain, moderate pain or headache (TAKE WITH MEALS.). 20 tablet 0  . predniSONE (DELTASONE) 10 MG tablet Take 1-2 tabs for 3-5 days then discontinue as needed for  flares for Rheumatoid Arthritis     No current facility-administered medications for this visit.    Allergies:  Allergies  Allergen Reactions  . Kimberly Grimes [Dexamethasone] Other (See Comments)    Vaginal itching and burning reaction.  . Kimberly Grimes [Acetaminophen] Other (See Comments)    Upset stomach      .  Physical Exam: Blood pressure (!) 142/78, pulse 65, temperature (!) 97.1 F (36.2 C), temperature source Tympanic, resp. rate 18, height 5\' 4"  (1.626 m), weight 189 lb 3.2 oz (85.8 kg), SpO2 100 %.   ECOG: 0    General appearance: Alert, awake without any distress. Head: Atraumatic without abnormalities Oropharynx: Without any thrush or ulcers. Eyes: No scleral icterus. Lymph nodes: No lymphadenopathy noted in the cervical, supraclavicular, or axillary nodes Heart:regular rate and rhythm, without any murmurs or gallops.   Lung: Clear to auscultation without any rhonchi, wheezes or dullness to percussion. Abdomin: Soft, nontender without any shifting dullness or ascites. Musculoskeletal: No clubbing or cyanosis. Neurological: No motor or sensory deficits. Skin: No rashes or lesions.      Lab Results: Lab Results  Component Value Date   WBC 4.6 01/20/2020   HGB 6.8 (LL) 01/20/2020   HCT 23.8 (L) 01/20/2020   MCV 56.9 (L) 01/20/2020   PLT 355 01/20/2020     Chemistry      Component Value Date/Time   NA 138 01/10/2020 1447   NA 140 04/27/2014 0931   K 3.5 01/10/2020 1447  K 4.1 04/27/2014 0931   CL 106 01/10/2020 1447   CO2 23 01/10/2020 1447   CO2 25 04/27/2014 0931   BUN 14 01/10/2020 1447   BUN 13.1 04/27/2014 0931   CREATININE 0.68 01/10/2020 1447   CREATININE 0.7 04/27/2014 0931      Component Value Date/Time   CALCIUM 8.7 (L) 01/10/2020 1447   CALCIUM 9.3 04/27/2014 0931   ALKPHOS 82 04/27/2014 0931   AST 10 04/27/2014 0931   ALT 11 04/27/2014 0931   BILITOT <0.20 04/27/2014 0931      Impression and Plan:   48 year old woman with:  1.   Iron deficiency anemia diagnosed in 2013.  She has recurrent chronic menstrual blood losses contributed to her iron deficiency.  She is intolerant to oral iron and has poor absorption as well.  .  Iron studies on January 20, 2020 showed severe iron deficiency with iron of 15 and ferritin of 4 with a hemoglobin of 6.8.  After IV iron infusion her hemoglobin did improve at this time but is currently at 9.1 although her MCV remains low with elevated RDW.  Risks and benefits of repeating intravenous iron were discussed at this time.  Potential complications include arthralgias, myalgias and infusion related complications were reviewed.  After discussion today, she is agreeable to proceed.  2.  Menorrhagia: For the most part her menstrual cycles have been normal but her last few were heavy.  I recommended follow-up with gynecology regarding this issue.  3.  Follow-up: In 3 months for repeat follow-up.   30  minutes were spent on this encounter.  Time was dedicated to reviewing laboratory data, updating her disease status, discussing treatment options and future plan of care review.     Zola Button 9/30/20219:42 AM

## 2020-04-15 ENCOUNTER — Telehealth: Payer: Self-pay | Admitting: Oncology

## 2020-04-15 NOTE — Telephone Encounter (Signed)
Scheduled per 09/30 los, called patient and left a voicemail.

## 2020-04-20 ENCOUNTER — Ambulatory Visit: Payer: Medicare Other

## 2020-04-22 ENCOUNTER — Inpatient Hospital Stay: Payer: Medicare Other | Attending: Oncology

## 2020-04-22 ENCOUNTER — Other Ambulatory Visit: Payer: Self-pay

## 2020-04-22 VITALS — BP 130/72 | HR 62 | Temp 98.0°F | Resp 18

## 2020-04-22 DIAGNOSIS — D500 Iron deficiency anemia secondary to blood loss (chronic): Secondary | ICD-10-CM | POA: Insufficient documentation

## 2020-04-22 DIAGNOSIS — D509 Iron deficiency anemia, unspecified: Secondary | ICD-10-CM

## 2020-04-22 MED ORDER — SODIUM CHLORIDE 0.9 % IV SOLN
510.0000 mg | Freq: Once | INTRAVENOUS | Status: AC
  Administered 2020-04-22: 510 mg via INTRAVENOUS
  Filled 2020-04-22: qty 510

## 2020-04-22 MED ORDER — SODIUM CHLORIDE 0.9 % IV SOLN
Freq: Once | INTRAVENOUS | Status: AC
  Filled 2020-04-22: qty 250

## 2020-04-22 NOTE — Patient Instructions (Signed)

## 2020-04-22 NOTE — Progress Notes (Signed)
Pt  States that she feels fine. She declines the duration of her observation time. VSS stable at discharge.

## 2020-04-29 ENCOUNTER — Other Ambulatory Visit: Payer: Self-pay

## 2020-04-29 ENCOUNTER — Inpatient Hospital Stay: Payer: Medicare Other

## 2020-04-29 VITALS — BP 143/79 | HR 66 | Temp 97.7°F | Resp 18

## 2020-04-29 DIAGNOSIS — D500 Iron deficiency anemia secondary to blood loss (chronic): Secondary | ICD-10-CM | POA: Diagnosis not present

## 2020-04-29 DIAGNOSIS — D509 Iron deficiency anemia, unspecified: Secondary | ICD-10-CM

## 2020-04-29 MED ORDER — SODIUM CHLORIDE 0.9 % IV SOLN
510.0000 mg | Freq: Once | INTRAVENOUS | Status: AC
  Administered 2020-04-29: 510 mg via INTRAVENOUS
  Filled 2020-04-29: qty 510

## 2020-04-29 MED ORDER — SODIUM CHLORIDE 0.9 % IV SOLN
Freq: Once | INTRAVENOUS | Status: AC
  Filled 2020-04-29: qty 250

## 2020-04-29 NOTE — Patient Instructions (Signed)

## 2020-07-14 ENCOUNTER — Telehealth: Payer: Self-pay

## 2020-07-14 ENCOUNTER — Inpatient Hospital Stay (HOSPITAL_BASED_OUTPATIENT_CLINIC_OR_DEPARTMENT_OTHER): Payer: Medicare Other | Admitting: Oncology

## 2020-07-14 ENCOUNTER — Inpatient Hospital Stay: Payer: Medicare Other | Attending: Oncology

## 2020-07-14 ENCOUNTER — Other Ambulatory Visit: Payer: Self-pay

## 2020-07-14 VITALS — BP 145/78 | HR 77 | Temp 98.1°F | Resp 18 | Ht 64.0 in | Wt 200.4 lb

## 2020-07-14 DIAGNOSIS — Z7952 Long term (current) use of systemic steroids: Secondary | ICD-10-CM | POA: Diagnosis not present

## 2020-07-14 DIAGNOSIS — N920 Excessive and frequent menstruation with regular cycle: Secondary | ICD-10-CM | POA: Diagnosis not present

## 2020-07-14 DIAGNOSIS — Z79899 Other long term (current) drug therapy: Secondary | ICD-10-CM | POA: Diagnosis not present

## 2020-07-14 DIAGNOSIS — Z791 Long term (current) use of non-steroidal anti-inflammatories (NSAID): Secondary | ICD-10-CM | POA: Insufficient documentation

## 2020-07-14 DIAGNOSIS — D500 Iron deficiency anemia secondary to blood loss (chronic): Secondary | ICD-10-CM | POA: Insufficient documentation

## 2020-07-14 DIAGNOSIS — D509 Iron deficiency anemia, unspecified: Secondary | ICD-10-CM

## 2020-07-14 LAB — CBC WITH DIFFERENTIAL (CANCER CENTER ONLY)
Abs Immature Granulocytes: 0.01 10*3/uL (ref 0.00–0.07)
Basophils Absolute: 0 10*3/uL (ref 0.0–0.1)
Basophils Relative: 1 %
Eosinophils Absolute: 0.3 10*3/uL (ref 0.0–0.5)
Eosinophils Relative: 4 %
HCT: 30.6 % — ABNORMAL LOW (ref 36.0–46.0)
Hemoglobin: 10.1 g/dL — ABNORMAL LOW (ref 12.0–15.0)
Immature Granulocytes: 0 %
Lymphocytes Relative: 25 %
Lymphs Abs: 1.4 10*3/uL (ref 0.7–4.0)
MCH: 25.8 pg — ABNORMAL LOW (ref 26.0–34.0)
MCHC: 33 g/dL (ref 30.0–36.0)
MCV: 78.1 fL — ABNORMAL LOW (ref 80.0–100.0)
Monocytes Absolute: 0.4 10*3/uL (ref 0.1–1.0)
Monocytes Relative: 7 %
Neutro Abs: 3.6 10*3/uL (ref 1.7–7.7)
Neutrophils Relative %: 63 %
Platelet Count: 268 10*3/uL (ref 150–400)
RBC: 3.92 MIL/uL (ref 3.87–5.11)
RDW: 16 % — ABNORMAL HIGH (ref 11.5–15.5)
WBC Count: 5.7 10*3/uL (ref 4.0–10.5)
nRBC: 0 % (ref 0.0–0.2)

## 2020-07-14 LAB — IRON AND TIBC
Iron: 35 ug/dL — ABNORMAL LOW (ref 41–142)
Saturation Ratios: 10 % — ABNORMAL LOW (ref 21–57)
TIBC: 331 ug/dL (ref 236–444)
UIBC: 297 ug/dL (ref 120–384)

## 2020-07-14 LAB — FERRITIN: Ferritin: 11 ng/mL (ref 11–307)

## 2020-07-14 NOTE — Telephone Encounter (Signed)
Called patient and made her aware of Dr. Alver Fisher message below. Patient is aware to expect a call from the scheduling department.

## 2020-07-14 NOTE — Progress Notes (Signed)
Hematology and Oncology Follow Up Visit  Kimberly Grimes [id number] 1971-05-27 49 y.o. 07/14/2020 1:12 PM Patient, No Pcp Kimberly Song, MD   Principle Diagnosis: 49 year old women with anemia related to iron deficiency due to chronic menstrual blood losses diagnosed in 2013.   Prior Therapy: IV iron infusion on multiple occasions started in 2013.  Last infusion given in October 2021.  Current therapy: Repeat IV iron infusion as needed.   Interim History: Kimberly Grimes presents today for repeat follow-up.  Since the last visit, she received intravenous iron infusion without any major complications.  She denies any nausea, fatigue or infusion related complications.  She continues to have heavy menstrual cycles which has been chronic in nature.  She denies any hematochezia, melena or hemoptysis.   Medications: Unchanged on review.  Current Outpatient Medications  Medication Sig Dispense Refill  . albuterol (PROVENTIL HFA;VENTOLIN HFA) 108 (90 BASE) MCG/ACT inhaler Inhale 2 puffs into the lungs every 6 (six) hours as needed. For shortness of breath    . ferrous sulfate 325 (65 FE) MG tablet Take 1 tablet (325 mg total) by mouth daily with breakfast. 90 tablet 3  . HYDROcodone-acetaminophen (NORCO/VICODIN) 5-325 MG tablet Take 1 tablet by mouth every 6 (six) hours as needed for severe pain. 3 tablet 0  . ibuprofen (ADVIL,MOTRIN) 200 MG tablet Take 200 mg by mouth every 6 (six) hours as needed for moderate pain.    . naproxen (NAPROSYN) 500 MG tablet Take 500 mg by mouth 2 (two) times daily as needed for moderate pain.    . naproxen (NAPROSYN) 500 MG tablet Take 1 tablet (500 mg total) by mouth 2 (two) times daily as needed for mild pain, moderate pain or headache (TAKE WITH MEALS.). 20 tablet 0  . predniSONE (DELTASONE) 10 MG tablet Take 1-2 tabs for 3-5 days then discontinue as needed for flares for Rheumatoid Arthritis     No current facility-administered medications for this visit.     Allergies:  Allergies  Allergen Reactions  . Decadron [Dexamethasone] Other (See Comments)    Vaginal itching and burning reaction.  . Tylenol [Acetaminophen] Other (See Comments)    Upset stomach      .  Physical Exam:  Blood pressure (!) 145/78, pulse 77, temperature 98.1 F (36.7 C), temperature source Tympanic, resp. rate 18, height 5\' 4"  (1.626 m), weight 200 lb 6.4 oz (90.9 kg), SpO2 100 %.   ECOG: 0    General appearance: Comfortable appearing without any discomfort Head: Normocephalic without any trauma Oropharynx: Mucous membranes are moist and pink without any thrush or ulcers. Eyes: Pupils are equal and round reactive to light. Lymph nodes: No cervical, supraclavicular, inguinal or axillary lymphadenopathy.   Heart:regular rate and rhythm.  S1 and S2 without leg edema. Lung: Clear without any rhonchi or wheezes.  No dullness to percussion. Abdomin: Soft, nontender, nondistended with good bowel sounds.  No hepatosplenomegaly. Musculoskeletal: No joint deformity or effusion.  Full range of motion noted. Neurological: No deficits noted on motor, sensory and deep tendon reflex exam. Skin: No petechial rash or dryness.  Appeared moist.        Lab Results: Lab Results  Component Value Date   WBC 4.7 04/14/2020   HGB 9.1 (L) 04/14/2020   HCT 28.9 (L) 04/14/2020   MCV 71.4 (L) 04/14/2020   PLT 295 04/14/2020     Chemistry      Component Value Date/Time   NA 138 01/10/2020 1447   NA 140 04/27/2014 0931  K 3.5 01/10/2020 1447   K 4.1 04/27/2014 0931   CL 106 01/10/2020 1447   CO2 23 01/10/2020 1447   CO2 25 04/27/2014 0931   BUN 14 01/10/2020 1447   BUN 13.1 04/27/2014 0931   CREATININE 0.68 01/10/2020 1447   CREATININE 0.7 04/27/2014 0931      Component Value Date/Time   CALCIUM 8.7 (L) 01/10/2020 1447   CALCIUM 9.3 04/27/2014 0931   ALKPHOS 82 04/27/2014 0931   AST 10 04/27/2014 0931   ALT 11 04/27/2014 0931   BILITOT <0.20 04/27/2014  0931      Impression and Plan:   49 year old woman with:  1.  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    She is currently receiving intermittent intravenous iron as needed with the last infusion given in October 2021.  Risks and benefits of repeat iron infusion were discussed at this time.  Laboratory data were also reviewed and showed a hemoglobin that continues to improve currently at 10.1.  Her MCV is also correcting although has not completely normalized.  After discussion today, we will await the results of the iron studies and arrange for repeat intravenous iron if needed.  2.  Menorrhagia: I recommended follow-up with the gynecology regarding this issue.  3.  Follow-up: In 3 months a repeat evaluation.   30  minutes were spent on this visit.  The time was dedicated to reviewing disease status, reviewing laboratory data and outlining complications related to current and future therapy.     Zola Button 12/30/20211:12 PM

## 2020-07-14 NOTE — Telephone Encounter (Signed)
-----   Message from Benjiman Core, MD sent at 07/14/2020  3:01 PM EST ----- Please let her know her iron is better but still low. Will arrange for more IV iron infusion.

## 2020-07-21 ENCOUNTER — Telehealth: Payer: Self-pay | Admitting: Oncology

## 2020-07-21 NOTE — Telephone Encounter (Signed)
Scheduled per 12/30 los, patient has been called and voicemail was left.

## 2020-07-26 ENCOUNTER — Inpatient Hospital Stay: Payer: Medicare Other | Attending: Oncology

## 2020-07-26 ENCOUNTER — Other Ambulatory Visit: Payer: Self-pay

## 2020-07-26 VITALS — BP 155/86 | HR 72 | Temp 98.6°F | Resp 18

## 2020-07-26 DIAGNOSIS — D509 Iron deficiency anemia, unspecified: Secondary | ICD-10-CM

## 2020-07-26 DIAGNOSIS — D500 Iron deficiency anemia secondary to blood loss (chronic): Secondary | ICD-10-CM | POA: Diagnosis not present

## 2020-07-26 DIAGNOSIS — N920 Excessive and frequent menstruation with regular cycle: Secondary | ICD-10-CM | POA: Insufficient documentation

## 2020-07-26 MED ORDER — SODIUM CHLORIDE 0.9 % IV SOLN
Freq: Once | INTRAVENOUS | Status: AC
  Filled 2020-07-26: qty 250

## 2020-07-26 MED ORDER — SODIUM CHLORIDE 0.9 % IV SOLN
510.0000 mg | Freq: Once | INTRAVENOUS | Status: AC
  Administered 2020-07-26: 510 mg via INTRAVENOUS
  Filled 2020-07-26: qty 510

## 2020-07-26 NOTE — Progress Notes (Signed)
Patient refused 30 min post observation period.  Stable at discharge

## 2020-07-26 NOTE — Patient Instructions (Signed)

## 2020-08-02 ENCOUNTER — Inpatient Hospital Stay: Payer: Medicare Other

## 2020-08-02 ENCOUNTER — Other Ambulatory Visit: Payer: Self-pay

## 2020-08-02 VITALS — BP 144/88 | HR 73 | Temp 98.7°F | Resp 18

## 2020-08-02 DIAGNOSIS — D509 Iron deficiency anemia, unspecified: Secondary | ICD-10-CM

## 2020-08-02 DIAGNOSIS — D500 Iron deficiency anemia secondary to blood loss (chronic): Secondary | ICD-10-CM | POA: Diagnosis not present

## 2020-08-02 MED ORDER — SODIUM CHLORIDE 0.9 % IV SOLN
Freq: Once | INTRAVENOUS | Status: AC
  Filled 2020-08-02: qty 250

## 2020-08-02 MED ORDER — SODIUM CHLORIDE 0.9 % IV SOLN
510.0000 mg | Freq: Once | INTRAVENOUS | Status: AC
  Administered 2020-08-02: 510 mg via INTRAVENOUS
  Filled 2020-08-02: qty 510

## 2020-08-02 NOTE — Progress Notes (Signed)
Patient declined post Feraheme observation. Verbal education provided. 

## 2020-08-02 NOTE — Patient Instructions (Signed)
Ferumoxytol injection What is this medicine? FERUMOXYTOL is an iron complex. Iron is used to make healthy red blood cells, which carry oxygen and nutrients throughout the body. This medicine is used to treat iron deficiency anemia. This medicine may be used for other purposes; ask your health care provider or pharmacist if you have questions. COMMON BRAND NAME(S): Feraheme What should I tell my health care provider before I take this medicine? They need to know if you have any of these conditions:  anemia not caused by low iron levels  high levels of iron in the blood  magnetic resonance imaging (MRI) test scheduled  an unusual or allergic reaction to iron, other medicines, foods, dyes, or preservatives  pregnant or trying to get pregnant  breast-feeding How should I use this medicine? This medicine is for injection into a vein. It is given by a health care professional in a hospital or clinic setting. Talk to your pediatrician regarding the use of this medicine in children. Special care may be needed. Overdosage: If you think you have taken too much of this medicine contact a poison control center or emergency room at once. NOTE: This medicine is only for you. Do not share this medicine with others. What if I miss a dose? It is important not to miss your dose. Call your doctor or health care professional if you are unable to keep an appointment. What may interact with this medicine? This medicine may interact with the following medications:  other iron products This list may not describe all possible interactions. Give your health care provider a list of all the medicines, herbs, non-prescription drugs, or dietary supplements you use. Also tell them if you smoke, drink alcohol, or use illegal drugs. Some items may interact with your medicine. What should I watch for while using this medicine? Visit your doctor or healthcare professional regularly. Tell your doctor or healthcare  professional if your symptoms do not start to get better or if they get worse. You may need blood work done while you are taking this medicine. You may need to follow a special diet. Talk to your doctor. Foods that contain iron include: whole grains/cereals, dried fruits, beans, or peas, leafy green vegetables, and organ meats (liver, kidney). What side effects may I notice from receiving this medicine? Side effects that you should report to your doctor or health care professional as soon as possible:  allergic reactions like skin rash, itching or hives, swelling of the face, lips, or tongue  breathing problems  changes in blood pressure  feeling faint or lightheaded, falls  fever or chills  flushing, sweating, or hot feelings  swelling of the ankles or feet Side effects that usually do not require medical attention (report to your doctor or health care professional if they continue or are bothersome):  diarrhea  headache  nausea, vomiting  stomach pain This list may not describe all possible side effects. Call your doctor for medical advice about side effects. You may report side effects to FDA at 1-800-FDA-1088. Where should I keep my medicine? This drug is given in a hospital or clinic and will not be stored at home. NOTE: This sheet is a summary. It may not cover all possible information. If you have questions about this medicine, talk to your doctor, pharmacist, or health care provider.  2021 Elsevier/Gold Standard (2016-08-20 20:21:10)  

## 2020-10-13 ENCOUNTER — Inpatient Hospital Stay (HOSPITAL_BASED_OUTPATIENT_CLINIC_OR_DEPARTMENT_OTHER): Payer: Medicare Other | Admitting: Oncology

## 2020-10-13 ENCOUNTER — Other Ambulatory Visit: Payer: Self-pay

## 2020-10-13 ENCOUNTER — Inpatient Hospital Stay: Payer: Medicare Other | Attending: Oncology

## 2020-10-13 VITALS — BP 158/88 | HR 76 | Temp 97.9°F | Resp 19 | Ht 64.0 in | Wt 201.0 lb

## 2020-10-13 DIAGNOSIS — N920 Excessive and frequent menstruation with regular cycle: Secondary | ICD-10-CM | POA: Diagnosis present

## 2020-10-13 DIAGNOSIS — D500 Iron deficiency anemia secondary to blood loss (chronic): Secondary | ICD-10-CM | POA: Insufficient documentation

## 2020-10-13 DIAGNOSIS — D509 Iron deficiency anemia, unspecified: Secondary | ICD-10-CM

## 2020-10-13 LAB — CBC WITH DIFFERENTIAL (CANCER CENTER ONLY)
Abs Immature Granulocytes: 0.02 10*3/uL (ref 0.00–0.07)
Basophils Absolute: 0 10*3/uL (ref 0.0–0.1)
Basophils Relative: 0 %
Eosinophils Absolute: 0.2 10*3/uL (ref 0.0–0.5)
Eosinophils Relative: 4 %
HCT: 37.6 % (ref 36.0–46.0)
Hemoglobin: 12.6 g/dL (ref 12.0–15.0)
Immature Granulocytes: 0 %
Lymphocytes Relative: 26 %
Lymphs Abs: 1.3 10*3/uL (ref 0.7–4.0)
MCH: 26.3 pg (ref 26.0–34.0)
MCHC: 33.5 g/dL (ref 30.0–36.0)
MCV: 78.5 fL — ABNORMAL LOW (ref 80.0–100.0)
Monocytes Absolute: 0.3 10*3/uL (ref 0.1–1.0)
Monocytes Relative: 7 %
Neutro Abs: 3 10*3/uL (ref 1.7–7.7)
Neutrophils Relative %: 63 %
Platelet Count: 250 10*3/uL (ref 150–400)
RBC: 4.79 MIL/uL (ref 3.87–5.11)
RDW: 14.9 % (ref 11.5–15.5)
WBC Count: 4.9 10*3/uL (ref 4.0–10.5)
nRBC: 0 % (ref 0.0–0.2)

## 2020-10-13 LAB — IRON AND TIBC
Iron: 44 ug/dL (ref 41–142)
Saturation Ratios: 14 % — ABNORMAL LOW (ref 21–57)
TIBC: 306 ug/dL (ref 236–444)
UIBC: 262 ug/dL (ref 120–384)

## 2020-10-13 LAB — FERRITIN: Ferritin: 28 ng/mL (ref 11–307)

## 2020-10-13 NOTE — Progress Notes (Signed)
Hematology and Oncology Follow Up Visit  Kimberly Grimes [id number] 07-Jul-1971 50 y.o. 10/13/2020 2:40 PM Patient, No Pcp Per (Inactive)Kimberly Grimes, Kimberly Dad, MD   Principle Diagnosis: 89 year Grimes women with iron deficiency anemia related to chronic menstrual blood losses noted since  2013.   Prior Therapy: IV iron infusion on multiple occasions started in 2013.  Last infusion given in January 2022.  Current therapy: Active surveillance with repeat iron infusion as needed.   Interim History: [name] returns today for repeat evaluation.  Since the last visit, she reports feeling well without any major complaints.  She has tolerated Feraheme infusion without any complications with improvement in her overall symptoms and health.  She denies any excessive fatigue or dyspnea on exertion.  She denies any hematochezia or melena.  She reports her menstrual cycles have been more regular and not as heavy as previously.   Medications: Updated on review.  Current Outpatient Medications  Medication Sig Dispense Refill  . albuterol (PROVENTIL HFA;VENTOLIN HFA) 108 (90 BASE) MCG/ACT inhaler Inhale 2 puffs into the lungs every 6 (six) hours as needed. For shortness of breath    . ferrous sulfate 325 (65 FE) MG tablet Take 1 tablet (325 mg total) by mouth daily with breakfast. 90 tablet 3  . HYDROcodone-acetaminophen (NORCO/VICODIN) 5-325 MG tablet Take 1 tablet by mouth every 6 (six) hours as needed for severe pain. 3 tablet 0  . ibuprofen (ADVIL,MOTRIN) 200 MG tablet Take 200 mg by mouth every 6 (six) hours as needed for moderate pain.    . naproxen (NAPROSYN) 500 MG tablet Take 500 mg by mouth 2 (two) times daily as needed for moderate pain.    . naproxen (NAPROSYN) 500 MG tablet Take 1 tablet (500 mg total) by mouth 2 (two) times daily as needed for mild pain, moderate pain or headache (TAKE WITH MEALS.). 20 tablet 0  . predniSONE (DELTASONE) 10 MG tablet Take 1-2 tabs for 3-5 days then discontinue as  needed for flares for Rheumatoid Arthritis     No current facility-administered medications for this visit.    Allergies:  Allergies  Allergen Reactions  . Decadron [Dexamethasone] Other (See Comments)    Vaginal itching and burning reaction.  . Tylenol [Acetaminophen] Other (See Comments)    Upset stomach      .  Physical Exam:  Blood pressure (!) 158/88, pulse 76, temperature 97.9 F (36.6 C), temperature source Tympanic, resp. rate 19, height 5\' 4"  (1.626 m), weight 201 lb (91.2 kg), SpO2 99 %.    ECOG: 0   General appearance: Alert, awake without any distress. Head: Atraumatic without abnormalities Oropharynx: Without any thrush or ulcers. Eyes: No scleral icterus. Lymph nodes: No lymphadenopathy noted in the cervical, supraclavicular, or axillary nodes Heart:regular rate and rhythm, without any murmurs or gallops.   Lung: Clear to auscultation without any rhonchi, wheezes or dullness to percussion. Abdomin: Soft, nontender without any shifting dullness or ascites. Musculoskeletal: No clubbing or cyanosis. Neurological: No motor or sensory deficits. Skin: No rashes or lesions.        Lab Results: Lab Results  Component Value Date   WBC 5.7 07/14/2020   HGB 10.1 (L) 07/14/2020   HCT 30.6 (L) 07/14/2020   MCV 78.1 (L) 07/14/2020   PLT 268 07/14/2020     Chemistry      Component Value Date/Time   NA 138 01/10/2020 1447   NA 140 04/27/2014 0931   K 3.5 01/10/2020 1447   K 4.1 04/27/2014 0931  CL 106 01/10/2020 1447   CO2 23 01/10/2020 1447   CO2 25 04/27/2014 0931   BUN 14 01/10/2020 1447   BUN 13.1 04/27/2014 0931   CREATININE 0.68 01/10/2020 1447   CREATININE 0.7 04/27/2014 0931      Component Value Date/Time   CALCIUM 8.7 (L) 01/10/2020 1447   CALCIUM 9.3 04/27/2014 0931   ALKPHOS 82 04/27/2014 0931   AST 10 04/27/2014 0931   ALT 11 04/27/2014 0931   BILITOT <0.20 04/27/2014 0931      Impression and Plan:   51 year Grimes woman  with:  1.  Iron deficiency anemia diagnosed in 2013.  Her anemia is related to chronic menstrual blood losses.  .    The status was updated at this time and treatment options were reviewed.  She has repeated IV iron infusion treatment without any major complications.  Laboratory testing in December 2021 continues to show improvement in her iron studies and hemoglobin but remained deficient.  She received IV iron infusion in January 2022.  Complication associated with Feraheme infusion including nausea, fatigue or arthralgias and infusion related complications.  Laboratory from today reviewed and showed normalization of her hemoglobin with iron studies are currently pending.  Will await these results and determine the next infusion depending on these results.  2.  Menorrhagia: Menstrual cycles have been more regular and less heavy at this time.  3.  Follow-up: 3 months for repeat evaluation.   30  minutes were dedicated to this encounter.  The time was spent on reviewing laboratory data, treatment options and future plan of care discussion.     Zola Button 3/31/20222:40 PM

## 2020-10-14 ENCOUNTER — Telehealth: Payer: Self-pay | Admitting: Oncology

## 2020-10-14 NOTE — Telephone Encounter (Signed)
Scheduled follow-up appointment per 3/31 los. Patient is aware.

## 2021-01-13 ENCOUNTER — Telehealth: Payer: Self-pay | Admitting: *Deleted

## 2021-01-13 ENCOUNTER — Inpatient Hospital Stay: Payer: Medicare Other

## 2021-01-13 ENCOUNTER — Other Ambulatory Visit: Payer: Self-pay

## 2021-01-13 ENCOUNTER — Inpatient Hospital Stay: Payer: Medicare Other | Attending: Oncology | Admitting: Oncology

## 2021-01-13 VITALS — BP 137/83 | HR 72 | Temp 99.5°F | Resp 16 | Ht 64.0 in | Wt 201.7 lb

## 2021-01-13 DIAGNOSIS — G35 Multiple sclerosis: Secondary | ICD-10-CM | POA: Insufficient documentation

## 2021-01-13 DIAGNOSIS — D500 Iron deficiency anemia secondary to blood loss (chronic): Secondary | ICD-10-CM | POA: Insufficient documentation

## 2021-01-13 DIAGNOSIS — Z7952 Long term (current) use of systemic steroids: Secondary | ICD-10-CM | POA: Insufficient documentation

## 2021-01-13 DIAGNOSIS — Z79899 Other long term (current) drug therapy: Secondary | ICD-10-CM | POA: Insufficient documentation

## 2021-01-13 DIAGNOSIS — D509 Iron deficiency anemia, unspecified: Secondary | ICD-10-CM

## 2021-01-13 DIAGNOSIS — N920 Excessive and frequent menstruation with regular cycle: Secondary | ICD-10-CM | POA: Diagnosis present

## 2021-01-13 LAB — CBC WITH DIFFERENTIAL (CANCER CENTER ONLY)
Abs Immature Granulocytes: 0.01 10*3/uL (ref 0.00–0.07)
Basophils Absolute: 0 10*3/uL (ref 0.0–0.1)
Basophils Relative: 1 %
Eosinophils Absolute: 0.1 10*3/uL (ref 0.0–0.5)
Eosinophils Relative: 2 %
HCT: 36.5 % (ref 36.0–46.0)
Hemoglobin: 12.2 g/dL (ref 12.0–15.0)
Immature Granulocytes: 0 %
Lymphocytes Relative: 18 %
Lymphs Abs: 1 10*3/uL (ref 0.7–4.0)
MCH: 26.4 pg (ref 26.0–34.0)
MCHC: 33.4 g/dL (ref 30.0–36.0)
MCV: 79 fL — ABNORMAL LOW (ref 80.0–100.0)
Monocytes Absolute: 0.4 10*3/uL (ref 0.1–1.0)
Monocytes Relative: 7 %
Neutro Abs: 4.1 10*3/uL (ref 1.7–7.7)
Neutrophils Relative %: 72 %
Platelet Count: 269 10*3/uL (ref 150–400)
RBC: 4.62 MIL/uL (ref 3.87–5.11)
RDW: 13.8 % (ref 11.5–15.5)
WBC Count: 5.6 10*3/uL (ref 4.0–10.5)
nRBC: 0 % (ref 0.0–0.2)

## 2021-01-13 LAB — IRON AND TIBC
Iron: 68 ug/dL (ref 41–142)
Saturation Ratios: 20 % — ABNORMAL LOW (ref 21–57)
TIBC: 339 ug/dL (ref 236–444)
UIBC: 271 ug/dL (ref 120–384)

## 2021-01-13 LAB — FERRITIN: Ferritin: 12 ng/mL (ref 11–307)

## 2021-01-13 NOTE — Telephone Encounter (Signed)
Attempted to call with message below. No answer. No machine

## 2021-01-13 NOTE — Telephone Encounter (Signed)
-----   Message from Kimberly Portela, MD sent at 01/13/2021 12:05 PM EDT ----- Please let her know her iron is good. No iv iron needed

## 2021-01-13 NOTE — Progress Notes (Signed)
Hematology and Oncology Follow Up Visit  Kimberly Grimes [id number] 10-Nov-1970 50 y.o. 01/13/2021 10:03 AM Patient, No Pcp Per (Inactive)Kimberly Grimes, Mathis Dad, MD   Principle Diagnosis: 36 year old women with anemia related to chronic menstrual blood losses and iron deficiency noted in 2013.     Prior Therapy: IV iron infusion on multiple occasions started in 2013.  Last infusion given in January 2022.  Current therapy: Repeat IV iron as needed.  She is currently on oral iron supplements.   Interim History: [name] is here for a follow-up visit.  Since her last visit, she reports no major complaints at this time.  She denies any excessive fatigue, tiredness or weakness.  She denies any hematochezia or melena.  She denies any hemoptysis.  Her performance status and quality of life remained reasonable.  Her menstrual cycles have also slowed down significantly.  She continues on iron and other vitamin supplements for the time being.     Medications: Unchanged on review.  Current Outpatient Medications  Medication Sig Dispense Refill   albuterol (PROVENTIL HFA;VENTOLIN HFA) 108 (90 BASE) MCG/ACT inhaler Inhale 2 puffs into the lungs every 6 (six) hours as needed. For shortness of breath     ferrous sulfate 325 (65 FE) MG tablet Take 1 tablet (325 mg total) by mouth daily with breakfast. 90 tablet 3   HYDROcodone-acetaminophen (NORCO/VICODIN) 5-325 MG tablet Take 1 tablet by mouth every 6 (six) hours as needed for severe pain. 3 tablet 0   ibuprofen (ADVIL,MOTRIN) 200 MG tablet Take 200 mg by mouth every 6 (six) hours as needed for moderate pain.     naproxen (NAPROSYN) 500 MG tablet Take 500 mg by mouth 2 (two) times daily as needed for moderate pain.     naproxen (NAPROSYN) 500 MG tablet Take 1 tablet (500 mg total) by mouth 2 (two) times daily as needed for mild pain, moderate pain or headache (TAKE WITH MEALS.). 20 tablet 0   predniSONE (DELTASONE) 10 MG tablet Take 1-2 tabs for 3-5 days  then discontinue as needed for flares for Rheumatoid Arthritis     No current facility-administered medications for this visit.    Allergies:  Allergies  Allergen Reactions   Decadron [Dexamethasone] Other (See Comments)    Vaginal itching and burning reaction.   Tylenol [Acetaminophen] Other (See Comments)    Upset stomach      .  Physical Exam:  Blood pressure 137/83, pulse 72, temperature 99.5 F (37.5 C), temperature source Oral, resp. rate 16, height 5\' 4"  (1.626 m), weight 201 lb 11.2 oz (91.5 kg), SpO2 99 %.     ECOG: 0    General appearance: Comfortable appearing without any discomfort Head: Normocephalic without any trauma Oropharynx: Mucous membranes are moist and pink without any thrush or ulcers. Eyes: Pupils are equal and round reactive to light. Lymph nodes: No cervical, supraclavicular, inguinal or axillary lymphadenopathy.   Heart:regular rate and rhythm.  S1 and S2 without leg edema. Lung: Clear without any rhonchi or wheezes.  No dullness to percussion. Abdomin: Soft, nontender, nondistended with good bowel sounds.  No hepatosplenomegaly. Musculoskeletal: No joint deformity or effusion.  Full range of motion noted. Neurological: No deficits noted on motor, sensory and deep tendon reflex exam. Skin: No petechial rash or dryness.  Appeared moist.          Lab Results: Lab Results  Component Value Date   WBC 4.9 10/13/2020   HGB 12.6 10/13/2020   HCT 37.6 10/13/2020   MCV  78.5 (L) 10/13/2020   PLT 250 10/13/2020     Chemistry      Component Value Date/Time   NA 138 01/10/2020 1447   NA 140 04/27/2014 0931   K 3.5 01/10/2020 1447   K 4.1 04/27/2014 0931   CL 106 01/10/2020 1447   CO2 23 01/10/2020 1447   CO2 25 04/27/2014 0931   BUN 14 01/10/2020 1447   BUN 13.1 04/27/2014 0931   CREATININE 0.68 01/10/2020 1447   CREATININE 0.7 04/27/2014 0931      Component Value Date/Time   CALCIUM 8.7 (L) 01/10/2020 1447   CALCIUM 9.3  04/27/2014 0931   ALKPHOS 82 04/27/2014 0931   AST 10 04/27/2014 0931   ALT 11 04/27/2014 0931   BILITOT <0.20 04/27/2014 0931      Impression and Plan:   50 year old woman with:  1.  Iron deficiency anemia related to menstrual blood losses and poor iron absorption orally noted in 2013.    Laboratory data the last 3 months were noted.  Her hemoglobin on March 31 was 12.6 with iron level of 44 and ferritin of 28.  Laboratory data from today reviewed showed a hemoglobin of 12.3 with MCV of 79 and otherwise normal hematological parameters.  Iron studies are currently pending however.   Risks and benefits of repeat intravenous iron were discussed at this time.  Potential complications associated with repeat IV iron infusion including arthralgias, myalgias and rarely anaphylaxis.  At this time I recommended continuing oral iron replacement and deferring intravenous iron if she develops severe deficiency or inability to tolerate oral iron.  2.  Menorrhagia: Resolving at this time with more regular and less heavy menstrual cycles.  3.  Follow-up: In 6 months for repeat follow-up.   30  minutes were spent on this visit.  The time was dedicated to reviewing laboratory data, disease status update, treatment choices and complications related to therapy.     Zola Button 7/1/202210:03 AM

## 2021-02-04 ENCOUNTER — Emergency Department (HOSPITAL_BASED_OUTPATIENT_CLINIC_OR_DEPARTMENT_OTHER)
Admission: EM | Admit: 2021-02-04 | Discharge: 2021-02-04 | Disposition: A | Discharge status: Home / Self Care | Payer: Medicare Other | Attending: Emergency Medicine | Admitting: Emergency Medicine

## 2021-02-04 ENCOUNTER — Other Ambulatory Visit: Payer: Self-pay

## 2021-02-04 ENCOUNTER — Encounter (HOSPITAL_BASED_OUTPATIENT_CLINIC_OR_DEPARTMENT_OTHER): Payer: Self-pay

## 2021-02-04 DIAGNOSIS — U071 COVID-19: Secondary | ICD-10-CM | POA: Diagnosis not present

## 2021-02-04 DIAGNOSIS — R059 Cough, unspecified: Secondary | ICD-10-CM | POA: Diagnosis present

## 2021-02-04 DIAGNOSIS — B349 Viral infection, unspecified: Secondary | ICD-10-CM

## 2021-02-04 LAB — SARS CORONAVIRUS 2 (TAT 6-24 HRS): SARS Coronavirus 2: POSITIVE — AB

## 2021-02-04 MED ORDER — NAPROXEN 250 MG PO TABS
500.0000 mg | ORAL_TABLET | Freq: Once | ORAL | Status: AC
  Administered 2021-02-04: 500 mg via ORAL
  Filled 2021-02-04: qty 2

## 2021-02-04 NOTE — Discharge Instructions (Addendum)
You were evaluated in the Emergency Department and after careful evaluation, we did not find any emergent condition requiring admission or further testing in the hospital.  Your exam/testing today was overall reassuring.  Symptoms seem to be due to a viral illness, possibly COVID-19.  We have tested you for the coronavirus here in the Emergency Department.  Please isolate or quarantine at home until you receive a negative test result.  If positive, we recommend continued home quarantine per [facility] recommendations.   Please return to the Emergency Department if you experience any worsening of your condition.  Thank you for allowing [place] to be a part of your care.

## 2021-02-04 NOTE — ED Provider Notes (Signed)
[facility] Emergency Department Provider Note MRN:  [id number]  Arrival date & time: 02/04/21     Chief Complaint   Generalized Body Aches   History of Present Illness   Kimberly Grimes is a 49 y.o. year-old female with a history of rheumatoid arthritis presenting to the ED with chief complaint of body aches.  Fever, headache, body aches, cough for the past 24 hours.  Daughter has similar symptoms.  Vaccinated for Weed.  Denies any chest pain or shortness of breath, no abdominal pain, no vomiting, no diarrhea.  Symptoms moderate, constant, no exacerbating or alleviating factors.  Review of Systems  A complete 10 system review of systems was obtained and all systems are negative except as noted in the HPI and PMH.   Patient's Health History    Past Medical History:  Diagnosis Date   Anemia 12/11/2011   Arthritis    "all over" (01/14/2014)   Heart murmur    History of blood clots    Blood clots during menstrual cycle   History of blood transfusion 01/13/2014   "related to menses"   Iron deficiency anemia    Muscle spasms of lower extremity    Panic attack    Rheumatoid arthritis(714.0)    Sickle cell trait (HCC)    Varicose veins     Past Surgical History:  Procedure Laterality Date   TUBAL LIGATION  2007    Family History  Problem Relation Age of Onset   Rheum arthritis Mother     Social History   Socioeconomic History   Marital status: Single    Spouse name: Not on file   Number of children: Not on file   Years of education: Not on file   Highest education level: Not on file  Occupational History   Not on file  Tobacco Use   Smoking status: Never   Smokeless tobacco: Never  Substance and Sexual Activity   Alcohol use: Yes    Comment: 01/14/2014 "might have a drink or 2 couple times/yr, if that"   Drug use: No   Sexual activity: Not Currently    Birth control/protection: Surgical    Comment: BTL 2007  Other Topics Concern    Not on file  Social History Narrative   Not on file   Social Determinants of Health   Financial Resource Strain: Not on file  Food Insecurity: Not on file  Transportation Needs: Not on file  Physical Activity: Not on file  Stress: Not on file  Social Connections: Not on file  Intimate Partner Violence: Not on file     Physical Exam   Vitals:   02/04/21 0600  BP: (!) 173/104  Pulse: 78  Resp: 18  Temp: 99.6 F (37.6 C)  SpO2: 95%    CONSTITUTIONAL: Well-appearing, NAD NEURO:  Alert and oriented x 3, no focal deficits EYES:  eyes equal and reactive ENT/NECK:  no LAD, no JVD CARDIO: Regular rate, well-perfused, normal S1 and S2 PULM:  CTAB no wheezing or rhonchi GI/GU:  normal bowel sounds, non-distended, non-tender MSK/SPINE:  No gross deformities, no edema SKIN:  no rash, atraumatic PSYCH:  Appropriate speech and behavior  *Additional and/or pertinent findings included in MDM below  Diagnostic and Interventional Summary    EKG Interpretation  Date/Time:    Ventricular Rate:    PR Interval:    QRS Duration:   QT Interval:    QTC Calculation:   R Axis:     Text Interpretation:  Labs Reviewed  SARS CORONAVIRUS 2 (TAT 6-24 HRS)    No orders to display    Medications  naproxen (NAPROSYN) tablet 500 mg (has no administration in time range)     Procedures  /  Critical Care Procedures  ED Course and Medical Decision Making  I have reviewed the triage vital signs, the nursing notes, and pertinent available records from the EMR.  Listed above are laboratory and imaging tests that I personally ordered, reviewed, and interpreted and then considered in my medical decision making (see below for details).  Consistent with viral illness, possibly COVID-19.  Patient is with no increased work of breathing, sitting comfortably, clear lungs, no hypoxia, vital signs reassuring, no indication for imaging or further testing, will swab for COVID, appropriate for  discharge.     Kimberly Grimes was evaluated in Emergency Department on 02/04/2021 for the symptoms described in the history of present illness. She was evaluated in the context of the global COVID-19 pandemic, which necessitated consideration that the patient might be at risk for infection with the SARS-CoV-2 virus that causes COVID-19. Institutional protocols and algorithms that pertain to the evaluation of patients at risk for COVID-19 are in a state of rapid change based on information released by regulatory bodies including the CDC and federal and state organizations. These policies and algorithms were followed during the patient's care in the ED.   [name]. [name], [place] [email or web address]  Final Clinical Impressions(s) / ED Diagnoses     ICD-10-CM   1. Viral illness  B34.9       ED Discharge Orders     None        Discharge Instructions Discussed with and Provided to Patient:    Discharge Instructions      You were evaluated in the Emergency Department and after careful evaluation, we did not find any emergent condition requiring admission or further testing in the hospital.  Your exam/testing today was overall reassuring.  Symptoms seem to be due to a viral illness, possibly COVID-19.  We have tested you for the coronavirus here in the Emergency Department.  Please isolate or quarantine at home until you receive a negative test result.  If positive, we recommend continued home quarantine per [facility] recommendations.   Please return to the Emergency Department if you experience any worsening of your condition.  Thank you for allowing [place] to be a part of your care.        Maudie Flakes, MD 02/04/21 [phone]

## 2021-02-04 NOTE — ED Triage Notes (Signed)
C/o generalized body aches, constant non productive cough, headache and possible fever. Sx began yesterday.
